# Patient Record
Sex: Female | Born: 1965 | Race: Black or African American | Hispanic: No | Marital: Single | Smoking: Never smoker
Health system: Southern US, Community
[De-identification: ages and names within clinical notes are randomized; demographics above are authoritative.]

## PROBLEM LIST (undated history)

## (undated) DIAGNOSIS — M1990 Unspecified osteoarthritis, unspecified site: Secondary | ICD-10-CM

## (undated) DIAGNOSIS — M51379 Other intervertebral disc degeneration, lumbosacral region without mention of lumbar back pain or lower extremity pain: Secondary | ICD-10-CM

## (undated) DIAGNOSIS — S92909A Unspecified fracture of unspecified foot, initial encounter for closed fracture: Secondary | ICD-10-CM

## (undated) DIAGNOSIS — M8580 Other specified disorders of bone density and structure, unspecified site: Secondary | ICD-10-CM

## (undated) DIAGNOSIS — M5137 Other intervertebral disc degeneration, lumbosacral region: Secondary | ICD-10-CM

## (undated) DIAGNOSIS — I10 Essential (primary) hypertension: Secondary | ICD-10-CM

## (undated) HISTORY — DX: Essential (primary) hypertension: I10

## (undated) HISTORY — PX: BREAST LUMPECTOMY: SHX2

## (undated) HISTORY — PX: VAGINAL HYSTERECTOMY: SUR661

## (undated) HISTORY — DX: Unspecified fracture of unspecified foot, initial encounter for closed fracture: S92.909A

## (undated) HISTORY — DX: Other specified disorders of bone density and structure, unspecified site: M85.80

## (undated) HISTORY — PX: OOPHORECTOMY: SHX86

## (undated) HISTORY — DX: Other intervertebral disc degeneration, lumbosacral region without mention of lumbar back pain or lower extremity pain: M51.379

## (undated) HISTORY — DX: Other intervertebral disc degeneration, lumbosacral region: M51.37

## (undated) HISTORY — DX: Unspecified osteoarthritis, unspecified site: M19.90

---

## 2005-02-28 ENCOUNTER — Other Ambulatory Visit: Admission: RE | Admit: 2005-02-28 | Discharge: 2005-02-28 | Payer: Self-pay | Admitting: Gynecology

## 2006-06-28 ENCOUNTER — Other Ambulatory Visit: Admission: RE | Admit: 2006-06-28 | Discharge: 2006-06-28 | Payer: Self-pay | Admitting: Gynecology

## 2007-07-21 ENCOUNTER — Other Ambulatory Visit: Admission: RE | Admit: 2007-07-21 | Discharge: 2007-07-21 | Payer: Self-pay | Admitting: Gynecology

## 2007-10-29 ENCOUNTER — Encounter: Admission: RE | Admit: 2007-10-29 | Discharge: 2007-10-29 | Payer: Self-pay | Admitting: Surgery

## 2007-10-30 ENCOUNTER — Ambulatory Visit (HOSPITAL_BASED_OUTPATIENT_CLINIC_OR_DEPARTMENT_OTHER): Admission: RE | Admit: 2007-10-30 | Discharge: 2007-10-30 | Payer: Self-pay | Admitting: Surgery

## 2007-10-30 ENCOUNTER — Encounter (INDEPENDENT_AMBULATORY_CARE_PROVIDER_SITE_OTHER): Payer: Self-pay | Admitting: Surgery

## 2008-07-21 ENCOUNTER — Other Ambulatory Visit: Admission: RE | Admit: 2008-07-21 | Discharge: 2008-07-21 | Payer: Self-pay | Admitting: Gynecology

## 2008-07-21 ENCOUNTER — Encounter: Payer: Self-pay | Admitting: Women's Health

## 2008-07-21 ENCOUNTER — Ambulatory Visit: Payer: Self-pay | Admitting: Women's Health

## 2008-07-29 ENCOUNTER — Ambulatory Visit: Payer: Self-pay | Admitting: Women's Health

## 2008-08-12 ENCOUNTER — Ambulatory Visit: Payer: Self-pay | Admitting: Gynecology

## 2008-08-31 ENCOUNTER — Ambulatory Visit: Payer: Self-pay | Admitting: Gynecology

## 2008-10-20 ENCOUNTER — Ambulatory Visit: Payer: Self-pay | Admitting: Gynecology

## 2008-11-01 ENCOUNTER — Encounter: Payer: Self-pay | Admitting: Gynecology

## 2008-11-01 ENCOUNTER — Ambulatory Visit (HOSPITAL_COMMUNITY): Admission: RE | Admit: 2008-11-01 | Discharge: 2008-11-02 | Payer: Self-pay | Admitting: Gynecology

## 2008-11-01 ENCOUNTER — Ambulatory Visit: Payer: Self-pay | Admitting: Gynecology

## 2008-11-15 ENCOUNTER — Ambulatory Visit: Payer: Self-pay | Admitting: Gynecology

## 2008-11-25 ENCOUNTER — Ambulatory Visit: Payer: Self-pay | Admitting: Gynecology

## 2008-12-15 ENCOUNTER — Ambulatory Visit: Payer: Self-pay | Admitting: Gynecology

## 2009-07-15 IMAGING — CR DG CHEST 2V
2 series · 2 of 2 positions shown · non-contrast
Comparison: None

CLINICAL DATA: Left breast cancer

CHEST - 2 VIEW

[w chest pa]
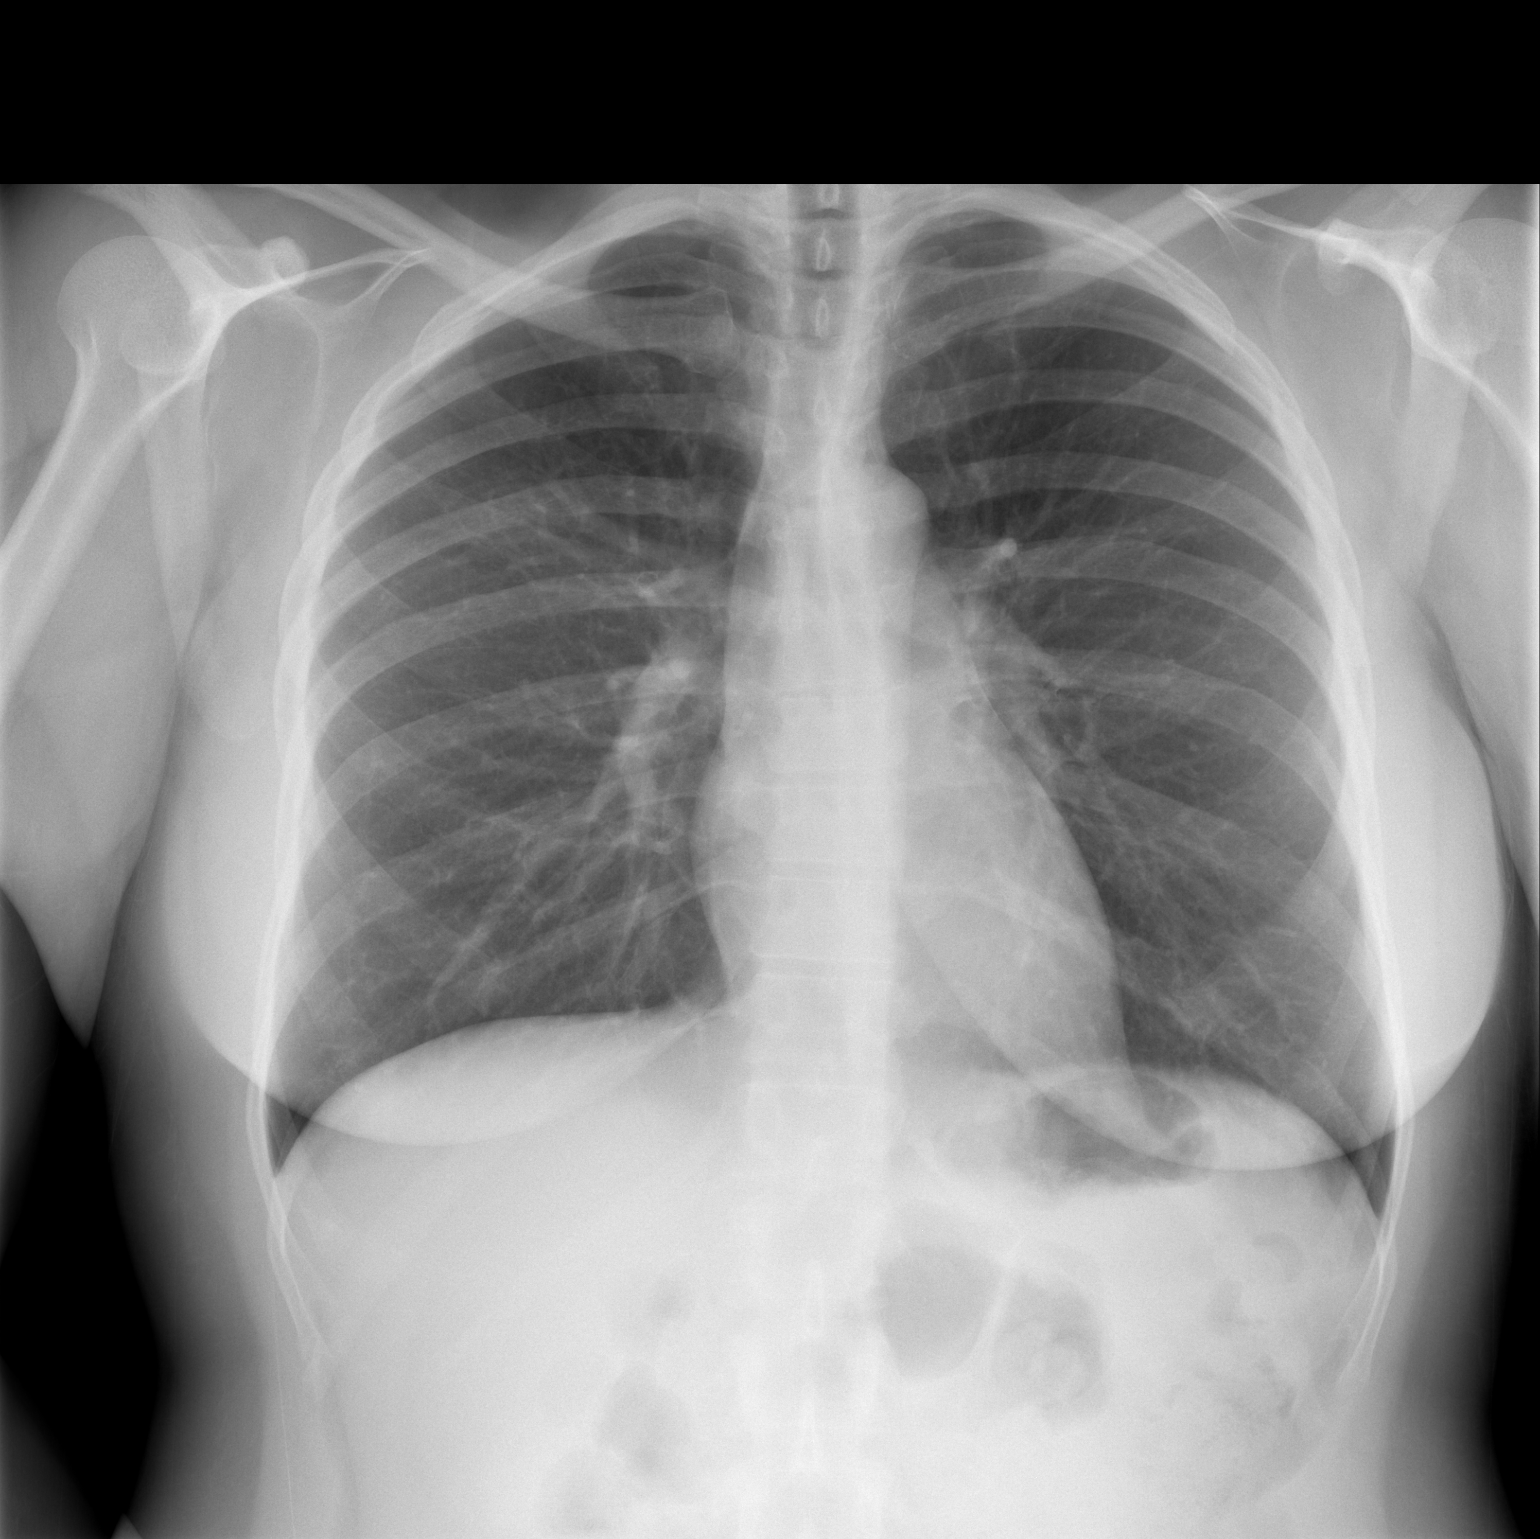

[w chest lat]
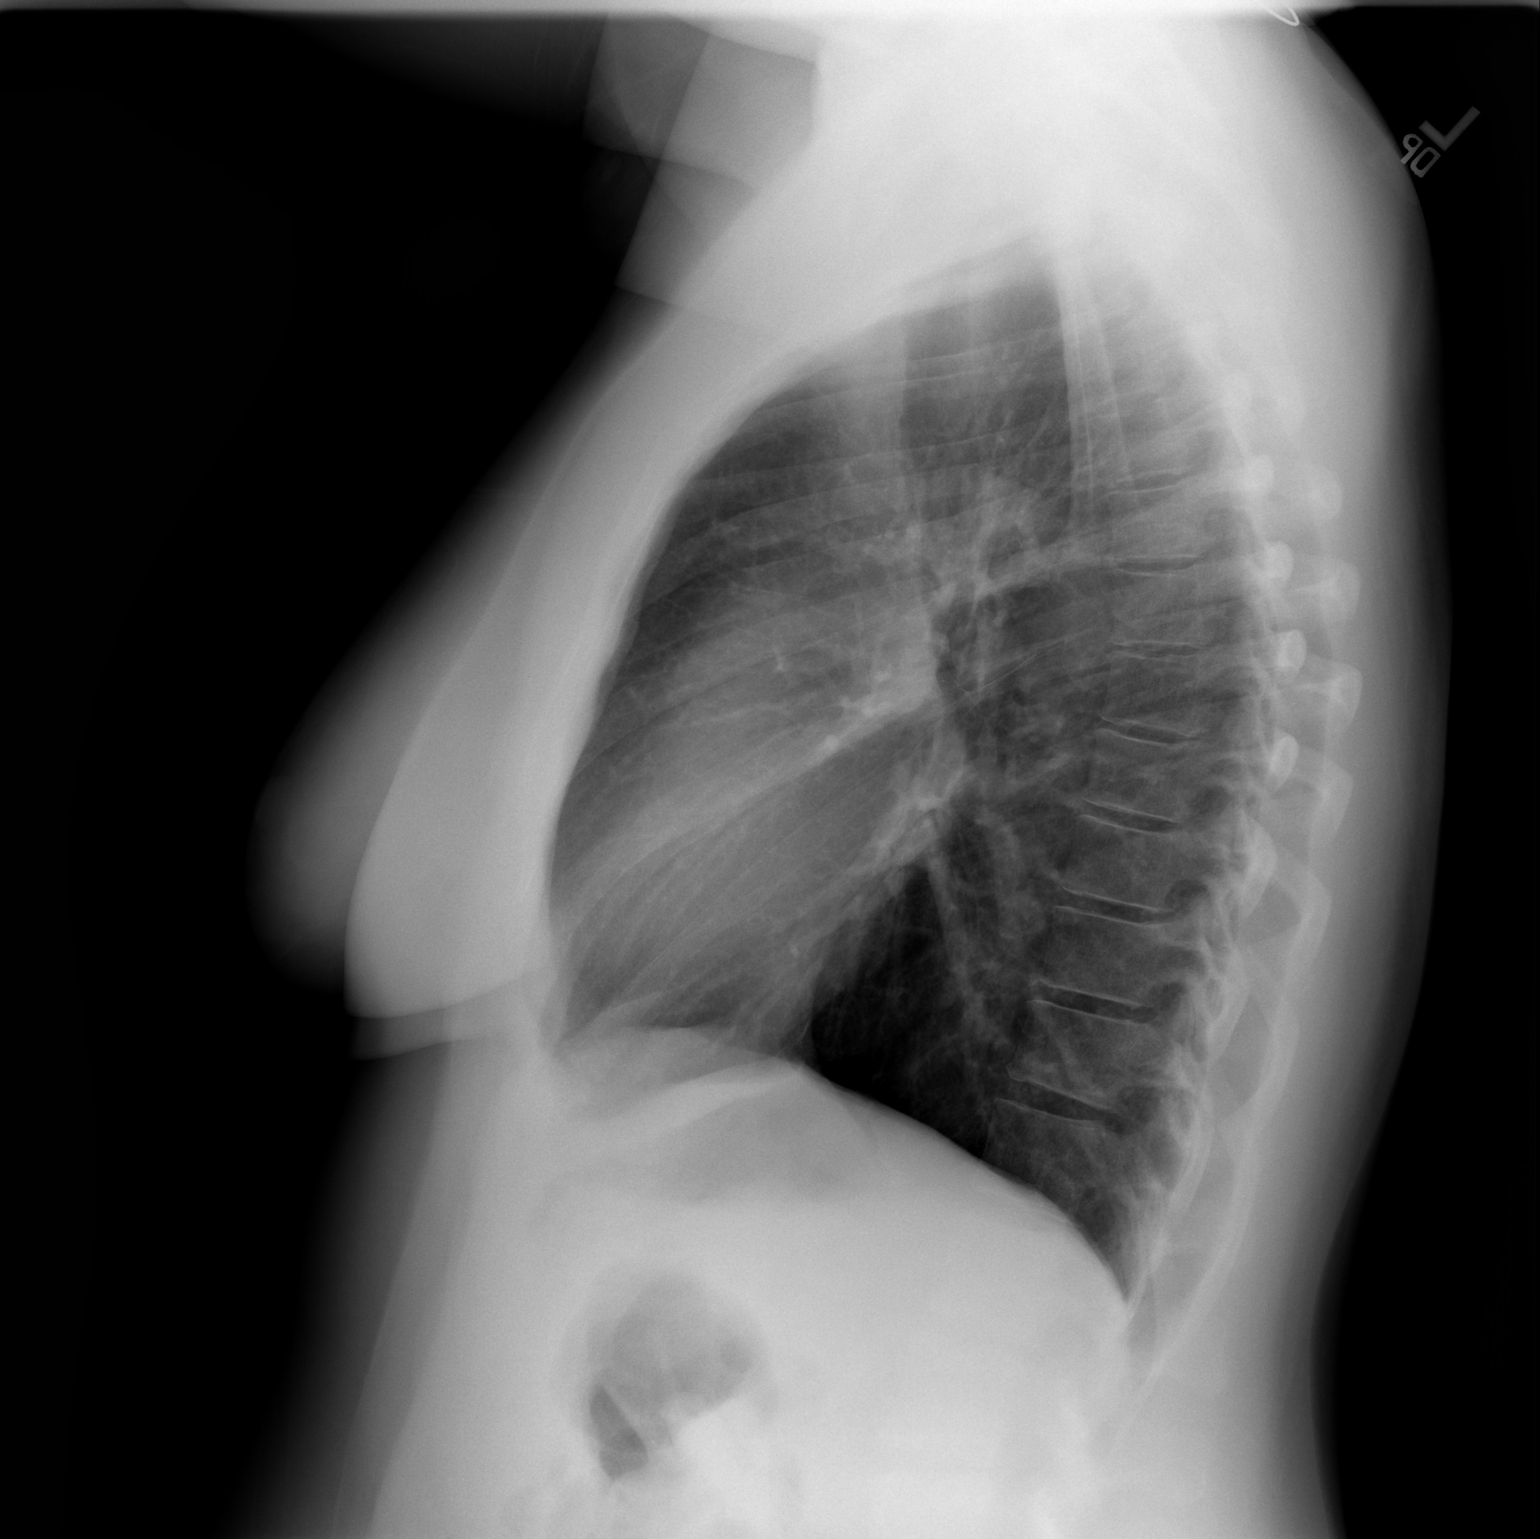

[2 of 2 positions shown; findings below may reference images not displayed]

FINDINGS: The heart size and mediastinal contours are within normal
limits.  Both lungs are clear.  The visualized skeletal structures
are unremarkable.
IMPRESSION: No active disease.

## 2009-08-15 ENCOUNTER — Other Ambulatory Visit: Admission: RE | Admit: 2009-08-15 | Discharge: 2009-08-15 | Payer: Self-pay | Admitting: Gynecology

## 2009-08-15 ENCOUNTER — Ambulatory Visit: Payer: Self-pay | Admitting: Women's Health

## 2009-09-05 ENCOUNTER — Ambulatory Visit: Payer: Self-pay | Admitting: Women's Health

## 2010-08-14 LAB — COMPREHENSIVE METABOLIC PANEL
AST: 25 U/L (ref 0–37)
Albumin: 3.9 g/dL (ref 3.5–5.2)
Alkaline Phosphatase: 61 U/L (ref 39–117)
Chloride: 102 mEq/L (ref 96–112)
GFR calc Af Amer: >60 mL/min (ref >60)
Potassium: 3.1 mEq/L — ABNORMAL LOW (ref 3.5–5.1)
Total Bilirubin: 0.9 mg/dL (ref 0.3–1.2)
Total Protein: 7.4 g/dL (ref 6.0–8.3)

## 2010-08-14 LAB — CBC
HCT: 29.1 % — ABNORMAL LOW (ref 36.0–46.0)
MCHC: 33.8 g/dL (ref 30.0–36.0)
MCV: 91 fL (ref 78.0–100.0)
Platelets: 160 10*3/uL (ref 150–400)
Platelets: 237 10*3/uL (ref 150–400)
RBC: 3.2 MIL/uL — ABNORMAL LOW (ref 3.87–5.11)
RDW: 16.4 % — ABNORMAL HIGH (ref 11.5–15.5)
WBC: 4.9 10*3/uL (ref 4.0–10.5)

## 2010-08-14 LAB — ELECTROLYTE PANEL
CO2: 30 mEq/L (ref 19–32)
Sodium: 138 mEq/L (ref 135–145)

## 2010-08-17 ENCOUNTER — Encounter: Payer: Self-pay | Admitting: Women's Health

## 2010-08-24 ENCOUNTER — Encounter: Payer: Self-pay | Admitting: Women's Health

## 2010-09-19 NOTE — Discharge Summary (Signed)
NAME:  Breanna Castro, Breanna Castro            ACCOUNT NO.:  [id number]   MEDICAL RECORD NO.:  [id number]          PATIENT TYPE:  OIB   LOCATION:  9320                          FACILITY:  [facility]   PHYSICIAN:  Timothy P. Fontaine, M.D.DATE OF BIRTH:  [birth year]   DATE OF ADMISSION:  11/01/2008  DATE OF DISCHARGE:  11/02/2008                               DISCHARGE SUMMARY   DISCHARGE DIAGNOSES:  Dysmenorrhea, menorrhagia, leiomyoma, right  hydrosalpinx, iron-deficiency anemia, hypokalemia.   PROCEDURE:  Laparoscopic-assisted vaginal hysterectomy, bilateral  salpingo-oophorectomy, November 01, 2008.   PATHOLOGY:  [id number]  Leimyomata, Adenomyosis, Right hydrosalpinx  uterine weight clinically 487 grams   HOSPITAL COURSE:  A 45 year old female with increasing menorrhagia,  dysmenorrhea, leiomyoma, history of right suspected hydrosalpinx.  The  patient underwent Lupron suppression for hemoglobin recovery and  leiomyoma shrinkage preoperatively and then ultimately underwent an  uncomplicated LAVH, BSO November 01, 2008.  The patient's postoperative  course was uncomplicated.  She was discharged on postoperative day #1.  Ambulating well, tolerating a regular diet, voiding without difficulty.  The patient's preoperative hemoglobin was 12.8, postoperative was 9.8,  and she was asymptomatic.  The patient also was noted to have a  preoperative potassium of 3.1.  She was given additional potassium  intravenously and her postoperative IV solutions, and on postoperative  day #1, was noted to have a potassium of 3.1.  She was instructed to  increase potassium at home at 20 mEq daily and will have a potassium  rechecked at a postoperative visit.  The patient received precautions,  instructions, and follow up and was given a discharge prescription for  Tylox #25, 1-2 p.o. q.4-6 h for pain and will be seen in 2 weeks  following discharge.       Timothy P. Fontaine, M.D.  Electronically Signed     TPF/MEDQ   D:  11/02/2008  T:  11/02/2008  Job:  [id number]

## 2010-09-19 NOTE — Op Note (Signed)
NAME:  Breanna Castro, Breanna Castro            ACCOUNT NO.:  [id number]   MEDICAL RECORD NO.:  [id number]          PATIENT TYPE:  AMB   LOCATION:  DSC                          FACILITY:  [facility]   PHYSICIAN:  Thomas A. Cornett, M.D.DATE OF BIRTH:  October 13, 1965   DATE OF PROCEDURE:  10/30/2007  DATE OF DISCHARGE:                               OPERATIVE REPORT   PREOPERATIVE DIAGNOSIS:  Left breast mass.   POSTOPERATIVE DIAGNOSIS:  Left breast mass.   PROCEDURE:  Left breast needle-localized lumpectomy.   SURGEON:  Maisie Fus A. Cornett, M.D.   ANESTHESIA:  MAC with 0.25% Sensorcaine local.   ESTIMATED BLOOD LOSS:  20 mL.   SPECIMEN:  Left breast mass with localizing wire and clip, verified by  specimen radiograph to be accurate.   DRAINS:  None.   INDICATIONS FOR PROCEDURE:  The patient is a 45 year old female who had  left breast mass biopsy.  There was some concern this may be phyllodes  tumor versus  fibroadenoma and excision was recommended.  She presents  today for excision of this mass using needle localization technique in  order to establish a definitive diagnosis.  Risks were discussed with  the patient.  She understood the risks and agreed to proceed.   DESCRIPTION OF PROCEDURE:  The patient was brought to the operating room  and placed supine.  Left breast was prepped and draped in sterile  fashion, after induction of MAC anesthesia.  A 0.25% Sensorcaine with  epinephrine was used in the area around the wire site which exceeded  about 3 o'clock on the left breast was injected.  Curvilinear incision  was made around the wire.  All tissue around the wire was excised to  include the mass.  Radiograph revealed the mass to be intact with clip  in place.  Irrigation was used suctioned out.  Hemostasis  was excellent.  The wound was closed in layers using a deep layer of 3-0  Vicryl and 4-0 Monocryl subcuticular stitch.  Dermabond was applied.  All final counts of sponge, needle, and  instruments were found to be  correct at this portion of the case.  The patient was then awoke taken  to recovery in satisfactory condition.      Thomas A. Cornett, M.D.  Electronically Signed     TAC/MEDQ  D:  10/30/2007  T:  10/31/2007  Job:  [id number]   cc:   Claris Che L. Yolanda Bonine, M.D.  Estrella Myrtle. Chestine Spore, [place]

## 2010-09-19 NOTE — Op Note (Signed)
NAME:  Breanna Castro, Breanna Castro            ACCOUNT NO.:  [id number]   MEDICAL RECORD NO.:  [id number]          PATIENT TYPE:  AMB   LOCATION:  SDC                           FACILITY:  [facility]   PHYSICIAN:  Timothy P. Fontaine, M.D.DATE OF BIRTH:  07/15/1965   DATE OF PROCEDURE:  11/01/2008  DATE OF DISCHARGE:                               OPERATIVE REPORT   PREOPERATIVE DIAGNOSES:  1. Leiomyoma.  2. Right hydrosalpinx.  3. Iron deficiency anemia.  4. Menorrhagia.  5. Dysmenorrhea.   POSTOPERATIVE DIAGNOSES:  1. Leiomyoma.  2. Right hydrosalpinx.  3. Iron deficiency anemia.  4. Menorrhagia.  5. Dysmenorrhea.   PROCEDURES:  1. Laparoscopic-assisted vaginal hysterectomy.  2. Bilateral salpingo-oophorectomy.   SURGEON:  Timothy P. Fontaine, MD   ASSISTANT:  Gaetano Hawthorne. Lily Peer, MD   ANESTHESIA:  General.   ESTIMATED BLOOD LOSS:  300 mL.   COMPLICATIONS:  None.   SPECIMEN:  Uterus.   CLINICAL WEIGHT:  480 g.   FINDINGS:  EUA:  External BUS, vagina grossly normal.  Cervix normal.  Uterus bulky, midline, mobile.  Adnexa without gross masses.  Surgical:  Anterior cul-de-sac normal.  Posterior cul-de-sac normal.  Uterus  enlarged with multiple myoma.  Right fallopian tube with evidence of  prior tubal sterilization.  Left fallopian tube enlarged with sausage-  shaped hydrosalpinx.  Right and left ovaries grossly normal, free and  mobile.  No evidence of endometriosis or pelvic adhesive disease.  Upper  abdominal exam was grossly normal to limited inspection.   PROCEDURE:  The patient was taken to the operating room, underwent  general anesthesia, was placed in low dorsal lithotomy position;  received an abdominal, perineal, vaginal preparation with Betadine  solution.  Bladder emptied with indwelling Foley catheterization.  EUA  performed and a Hulka tenaculum was placed in the cervix.  The patient  was draped in usual fashion.  A repeat transverse infraumbilical  incision was made  using the 10-mm OptiVu direct entry trocar.  The  abdomen was directly entered under direct visualization without  difficulty and subsequently insufflated.  Right and left 5-mm suprapubic  ports were then placed under direct visualization after  transillumination for the vessels without difficulty.  Examination of  pelvic organs and upper abdominal exam was carried out with findings  noted above.  Due to the bulk of the hydrosalpinx obscuring the right  adnexa, this was drained using a needle and aspiration to deflate the  hydrosalpinx.  After better visualization, the right infundibulopelvic  ligament and vessels were identified and elevated.  The ureter found to  be away from the operative site and using the Harmonic scalpel.  The  pedicle was transected without difficulty.  The broad ligament  peritoneal reflection was likewise transected to the level of the round  ligament which again was transected using the Harmonic scalpel.  The  right uterine vessel was transected without difficulty and the  vesicouterine peritoneal fold was transected to the midline.  Attention  was then turned to the left adnexa which was exposed.  The left  infundibulopelvic ligament again was isolated.  The ureter found to be  away from the operative site and was subsequently transected with the  Harmonic scalpel.  The remainder of the left adnexa was freed in a  process similar to the right meeting the vesicouterine peritoneal  incision anteriorly in the midline.  Attention was then turned to the  vaginal portion of the procedure.  The patient was placed in a high  dorsal lithotomy position and the Hulka tenaculum removed.  A weighted  speculum placed.  The cervix grasped and the cervical mucosa was  circumferentially injected using lidocaine and epinephrine mixture.  The  cervical mucosa was then sharply incised circumferentially and the  paracervical planes were sharply developed.  The posterior cul-de-sac   was ultimately entered.  A long weighted speculum placed and the right  and left uterosacral ligaments were identified, clamped, cut, and  ligated using 0 Vicryl suture and tagged for future reference.  The  anterior vesicouterine plane was sharply developed and the anterior cul-  de-sac was ultimately entered and the uterus was progressively freed  from its attachments through clamping, cutting, and ligating of the  cardinal ligaments and parametrial tissues using 0 Vicryl suture.  It  was evident that morcellation was necessary to remove the uterus and at  this point, the cervix was cored from the uterus and subsequently the  uterus morcellated and removed in pieces and ultimately any remaining  peritoneal connections were clamped, cut, and ligated using 0 Vicryl  suture.  The long weighted speculum was replaced with a shorter weighted  speculum.  The posterior vaginal cuff grasped with an Allis clamp.  A  tagged tail sponge moist was placed within the vagina to pack the  intestines from the posterior cul-de-sac.  A 0 Vicryl running  interlocking suture was placed from uterosacral ligament to uterosacral  ligament reapproximating the posterior vaginal cuff.  The sponge was  removed.  The posterior cul-de-sac irrigated.  Hemostasis visualized and  the vagina was closed anterior to posterior using 0 Vicryl suture in  interrupted figure-of-eight stitch.  The patient was placed in a low  dorsal lithotomy position.  The vagina was irrigated.  Hemostasis  visualized.  The operative team regloved and the abdomen was  reinsufflated.  All clot was removed.  The pelvis irrigated and  hemostasis was visualized.  All pedicles as well as the cuff showed  hemostasis under a low pressure situation.  The 5-mm ports were then  removed under a low pressure situation again showing hemostasis.  The  infraumbilical port was backed out under direct visualization showing  adequate hemostasis.  No evidence of  hernia formation.  A 0 Vicryl  interrupted subcutaneous fascial stitch was placed infraumbilically.  All skin incisions injected using 0.25% Marcaine.  The patient received  intraoperative Toradol and all skin incisions closed using 3-0 chromic  in interrupted cuticular stitch.  The patient was placed in the supine  position, awakened without difficulty and taken to recovery room in good  condition having tolerated the procedure well.      Timothy P. Fontaine, M.D.  Electronically Signed     TPF/MEDQ  D:  11/01/2008  T:  11/02/2008  Job:  [id number]

## 2010-09-19 NOTE — H&P (Signed)
NAME:  Breanna Castro, Breanna Castro            ACCOUNT NO.:  [id number]   MEDICAL RECORD NO.:  [id number]          PATIENT TYPE:  AMB   LOCATION:  SDC                           FACILITY:  [facility]   PHYSICIAN:  Timothy P. Fontaine, M.D.DATE OF BIRTH:  Jun 22, 1965   DATE OF ADMISSION:  DATE OF DISCHARGE:                              HISTORY & PHYSICAL   CHIEF COMPLAINT:  Menorrhagia, dysmenorrhea.   HISTORY OF PRESENT ILLNESS:  A 45 year old G69, P3 female, tubal  sterilization, history of increasing dysmenorrhea and menorrhagia, found  to have large myoma measuring 11 cm and a cystic serpentine mass in the  right adnexa consistent with a hydrosalpinx.  The patient was suppressed  on Depo-Lupron for approximately 3 months preoperatively and was  admitted for attempted LAVH/BSO, possible TAH-BSO.  Alternatives for  management were reviewed with the patient for more conservative  management.  She wants proceed with definitive surgery.   PAST MEDICAL HISTORY:  Significant for hypertension, anxiety, and  depression.   PAST SURGICAL HISTORY:  A breast lumpectomy and a tubal sterilization,  which included a mini lap and a subsequent laparoscopic tubal  sterilization.   CURRENT MEDICATIONS:  Lisinopril, hydrochlorothiazide, and Zoloft.   MEDICATION ALLERGIES:  None.   REVIEW OF SYSTEMS:  Noncontributory.   FAMILY HISTORY:  Noncontributory.   SOCIAL HISTORY:  Noncontributory.   ADMISSION PHYSICAL EXAMINATION:  VITAL SIGNS:  Afebrile, stable.  HEENT: Normal.  LUNGS:  Clear.  CARDIAC:  Regular rate without rubs, murmurs, or gallops.  ABDOMEN:  Benign.  PELVIC:  External BUS, vagina normal.  Cervix normal.  Uterus enlarged,  approximately 10-week size, midline and mobile.  Adnexa without gross  masses or tenderness.   ASSESSMENT:  A 45 year old G43, P3 female, status post tubal  sterilization, worsening dysmenorrhea, menorrhagia, iron-deficiency  anemia with large myoma, questionable hydrosalpinx in  the right adnexa  for hysterectomy BSO.  Options for management were reviewed to include  hormonal manipulation, IUD, myomectomy, embolization, and hysterectomy.  She wants to proceed with hysterectomy.  The long-term issues with  hysterectomy were reviewed to include the absolute irreversible  sterility associated with hysterectomy.  Sexuality following  hysterectomy to include persistent orgasmic dysfunction as well as  persistent dyspareunia and the ovarian conservation issue was reviewed  with her.  The options of keeping both ovaries or one ovary for  continued hormone production versus removing both ovaries was discussed.  She understands by removing her ovaries she will be hypoestrogenic  symptomatic as well as potential for accelerating disease such as  cardiovascular disease and bone disease and the potential significant  symptoms for estrogen replacement therapy.  [facility] study was reviewed  potential for increased risks of thrombosis, stroke, heart attack, DVT,  as well as  possible increased risks of breast cancer were reviewed.  The options of keeping one or both ovaries for hormone production and  the risk of ovarian disease in the future such as benign ovarian  disease, system pain as well as ovarian cancer was discussed.  The  patient has thought about these issues and wants to proceed with  removing both ovaries.  She accepts the risks of hypoestrogenism and the  potential for ERT.  We also discussed realistically, we will attempt  initially a laparoscopic approach, but given the size of the myoma as  well as the hydrosalpinx that there may be significant adhesive disease  encountered and that there is a realistic possibility of proceeding with  a total abdominal hysterectomy and bilateral salpingo-oophorectomy.  She  understands and accepts this, both from a longer hospitalization and  longer recovery standpoint.  The acute intraoperative postoperative  risks associated  hysterectomy were reviewed to include the risk of  infection, prolonged antibiotics, abscess or hematoma formation  requiring reoperation and drainage, incisional complications requiring  opening and draining of incisions closure by secondary intention, long-  term issues of hernia formation and keloid formation.  The risk of  hemorrhage necessitating transfusion and risks of transfusion were  reviewed to include transfusion reaction, hepatitis, HIV, McArdle  disease, and other unknown entities.  The risk of inadvertent injury to  internal organs including bowel, bladder, ureters, vessels and nerves  either immediately recognized or delay recognized was reviewed and the  issue with adhesive disease and hydrosalpinx in the realistic risk of  injury to surrounding structures was discussed and this may necessitate  major exploratory reparative surgery, bowel resection, bladder repair,  ureteral damage repair as well as ostomy formation possibilities was all  discussed, understood and accepted.  The patient's questions were  answered to her satisfaction.  Again, she has been on Depo-Lupron  suppression due to her iron-deficiency anemia, has been amenorrhea,  again we will go ahead and check a preoperative hemoglobin which is  pending at this time.  The patient also was placed on a bowel prep again  due to the possibility of adhesive disease.  The patient's questions  answered to her satisfaction.  She is ready to proceed with surgery.      Timothy P. Fontaine, M.D.  Electronically Signed     TPF/MEDQ  D:  10/20/2008  T:  10/21/2008  Job:  [id number]

## 2010-09-27 ENCOUNTER — Other Ambulatory Visit: Payer: Self-pay | Admitting: Women's Health

## 2010-09-27 ENCOUNTER — Other Ambulatory Visit (HOSPITAL_COMMUNITY)
Admission: RE | Admit: 2010-09-27 | Discharge: 2010-09-27 | Disposition: A | Discharge status: Home / Self Care | Payer: BC Managed Care – PPO | Source: Ambulatory Visit | Attending: Gynecology | Admitting: Gynecology

## 2010-09-27 ENCOUNTER — Encounter (INDEPENDENT_AMBULATORY_CARE_PROVIDER_SITE_OTHER): Payer: BC Managed Care – PPO | Admitting: Women's Health

## 2010-09-27 DIAGNOSIS — Z01419 Encounter for gynecological examination (general) (routine) without abnormal findings: Secondary | ICD-10-CM

## 2010-09-27 DIAGNOSIS — Z124 Encounter for screening for malignant neoplasm of cervix: Secondary | ICD-10-CM | POA: Insufficient documentation

## 2010-10-17 ENCOUNTER — Encounter (INDEPENDENT_AMBULATORY_CARE_PROVIDER_SITE_OTHER): Payer: BC Managed Care – PPO

## 2010-10-17 DIAGNOSIS — M899 Disorder of bone, unspecified: Secondary | ICD-10-CM

## 2010-10-26 ENCOUNTER — Other Ambulatory Visit (INDEPENDENT_AMBULATORY_CARE_PROVIDER_SITE_OTHER): Payer: BC Managed Care – PPO

## 2010-10-26 DIAGNOSIS — E559 Vitamin D deficiency, unspecified: Secondary | ICD-10-CM

## 2010-11-06 ENCOUNTER — Other Ambulatory Visit: Payer: BC Managed Care – PPO

## 2011-02-01 LAB — BASIC METABOLIC PANEL
CO2: 28
Chloride: 104
Creatinine, Ser: 0.89
GFR calc Af Amer: >60
Potassium: 3.4 — ABNORMAL LOW

## 2011-02-01 LAB — CBC
HCT: 29.1 — ABNORMAL LOW
MCHC: 33
MCV: 80.7
RBC: 3.6 — ABNORMAL LOW
WBC: 5.3

## 2011-02-01 LAB — DIFFERENTIAL
Basophils Relative: 1
Eosinophils Absolute: 0.1
Eosinophils Relative: 1
Lymphs Abs: 1.2
Monocytes Absolute: 0.5
Monocytes Relative: 10
Neutrophils Relative %: 65

## 2011-03-23 ENCOUNTER — Ambulatory Visit (INDEPENDENT_AMBULATORY_CARE_PROVIDER_SITE_OTHER): Payer: BC Managed Care – PPO | Admitting: *Deleted

## 2011-03-23 DIAGNOSIS — E559 Vitamin D deficiency, unspecified: Secondary | ICD-10-CM

## 2011-09-28 ENCOUNTER — Encounter: Payer: BC Managed Care – PPO | Admitting: Women's Health

## 2011-10-12 ENCOUNTER — Ambulatory Visit (INDEPENDENT_AMBULATORY_CARE_PROVIDER_SITE_OTHER): Payer: BC Managed Care – PPO | Admitting: Women's Health

## 2011-10-12 ENCOUNTER — Encounter: Payer: Self-pay | Admitting: Women's Health

## 2011-10-12 VITALS — BP 130/80 | Ht 65.25 in | Wt 208.0 lb

## 2011-10-12 DIAGNOSIS — I10 Essential (primary) hypertension: Secondary | ICD-10-CM | POA: Insufficient documentation

## 2011-10-12 DIAGNOSIS — F32A Depression, unspecified: Secondary | ICD-10-CM | POA: Insufficient documentation

## 2011-10-12 DIAGNOSIS — Z01419 Encounter for gynecological examination (general) (routine) without abnormal findings: Secondary | ICD-10-CM

## 2011-10-12 DIAGNOSIS — F341 Dysthymic disorder: Secondary | ICD-10-CM

## 2011-10-12 DIAGNOSIS — F329 Major depressive disorder, single episode, unspecified: Secondary | ICD-10-CM

## 2011-10-12 DIAGNOSIS — Z780 Asymptomatic menopausal state: Secondary | ICD-10-CM

## 2011-10-12 MED ORDER — ESTRADIOL 0.05 MG/24HR TD PTWK: 1.0000 | MEDICATED_PATCH | TRANSDERMAL | Status: DC

## 2011-10-12 NOTE — Progress Notes (Signed)
Breanna Castro 1965-09-04 [id number]    History:    The patient presents for annual exam.  LAVH with BSO for fibroids and menorrhagia/2010. States is doing much better since hysterectomy but is struggling with sleep due to numerous hot flushes. Initially states did not want to use hormones. History of mammograms with negative biopsies//fibroadenoma/left breast 2009. Osteopenia T score -1.2 at the spine/2012. Hypertension and history of anxiety and depression treated by primary care.   Past medical history, past surgical history, family history and social history were all reviewed and documented in the EPIC chart. Pre-K. teacher for [employer], has 2 adopted daughters ages 10 and 32, 2 stepchildren and 3 biological daughters. Normal colonoscopy 2010   ROS:  A  ROS was performed and pertinent positives and negatives are included in the history.  Exam:  Filed Vitals:   10/12/11 1524  BP: 130/80    General appearance:  Normal Head/Neck:  Normal, without cervical or supraclavicular adenopathy. Thyroid:  Symmetrical, normal in size, without palpable masses or nodularity. Respiratory  Effort:  Normal  Auscultation:  Clear without wheezing or rhonchi Cardiovascular  Auscultation:  Regular rate, without rubs, murmurs or gallops  Edema/varicosities:  Not grossly evident Abdominal  Soft,nontender, without masses, guarding or rebound.  Liver/spleen:  No organomegaly noted  Hernia:  None appreciated  Skin  Inspection:  Grossly normal  Palpation:  Grossly normal Neurologic/psychiatric  Orientation:  Normal with appropriate conversation.  Mood/affect:  Normal  Genitourinary    Breasts: Examined lying and sitting.     Right: Without masses, retractions, discharge or axillary adenopathy.     Left: Without masses, retractions, discharge or axillary adenopathy.   Inguinal/mons:  Normal without inguinal adenopathy  External genitalia:  Normal  BUS/Urethra/Skene's glands:   Normal  Bladder:  Normal  Vagina:  Normal  Cervix: Absent  Uterus: Absent  Adnexa/parametria:     Rt: Without masses or tenderness.   Lt: Without masses or tenderness.  Anus and perineum: Normal  Digital rectal exam: Normal sphincter tone without palpated masses or tenderness  Assessment/Plan:  46 y.o. DBF G5P3 for annual exam with complaint of poor rest and sleep /hot flushes.   Postmenopausal with numerous hot flushes Osteopenia T score -1.2  spine 10/2010 Hypertension/anxiety and depression-primary care labs and medications.  Plan: Options for hot flushes reviewed, reviewed estrogen patches. Reviewed slight risk for blood clots, strokes, breast cancer. Will try estradiol 0.05 patch weekly, prescription, proper use, given and reviewed instructed to call if symptoms do not decrease. Reviewed importance of exercise, decreasing calories for weight loss, vitamin D 2000 daily and calcium rich diet. Home safety and fall prevention reviewed. SBE's, annual mammogram, which is scheduled tomorrow. Encouraged condoms if become sexually active.      Harrington Challenger [facility], 4:26 PM 10/12/2011

## 2011-10-12 NOTE — Patient Instructions (Signed)
Vit D 2000 daily Health Maintenance, Females A healthy lifestyle and preventative care can promote health and wellness.  Maintain regular health, dental, and eye exams.   Eat a healthy diet. Foods like vegetables, fruits, whole grains, low-fat dairy products, and lean protein foods contain the nutrients you need without too many calories. Decrease your intake of foods high in solid fats, added sugars, and salt. Get information about a proper diet from your caregiver, if necessary.   Regular physical exercise is one of the most important things you can do for your health. Most adults should get at least 150 minutes of moderate-intensity exercise (any activity that increases your heart rate and causes you to sweat) each week. In addition, most adults need muscle-strengthening exercises on 2 or more days a week.    Maintain a healthy weight. The body mass index (BMI) is a screening tool to identify possible weight problems. It provides an estimate of body fat based on height and weight. Your caregiver can help determine your BMI, and can help you achieve or maintain a healthy weight. For adults 20 years and older:   A BMI below 18.5 is considered underweight.   A BMI of 18.5 to 24.9 is normal.   A BMI of 25 to 29.9 is considered overweight.   A BMI of 30 and above is considered obese.   Maintain normal blood lipids and cholesterol by exercising and minimizing your intake of saturated fat. Eat a balanced diet with plenty of fruits and vegetables. Blood tests for lipids and cholesterol should begin at age 60 and be repeated every 5 years. If your lipid or cholesterol levels are high, you are over 50, or you are a high risk for heart disease, you may need your cholesterol levels checked more frequently.Ongoing high lipid and cholesterol levels should be treated with medicines if diet and exercise are not effective.   If you smoke, find out from your caregiver how to quit. If you do not use tobacco,  do not start.   If you are pregnant, do not drink alcohol. If you are breastfeeding, be very cautious about drinking alcohol. If you are not pregnant and choose to drink alcohol, do not exceed 1 drink per day. One drink is considered to be 12 ounces (355 mL) of beer, 5 ounces (148 mL) of wine, or 1.5 ounces (44 mL) of liquor.   Avoid use of street drugs. Do not share needles with anyone. Ask for help if you need support or instructions about stopping the use of drugs.   High blood pressure causes heart disease and increases the risk of stroke. Blood pressure should be checked at least every 1 to 2 years. Ongoing high blood pressure should be treated with medicines, if weight loss and exercise are not effective.   If you are 53 to 49 years old, ask your caregiver if you should take aspirin to prevent strokes.   Diabetes screening involves taking a blood sample to check your fasting blood sugar level. This should be done once every 3 years, after age 30, if you are within normal weight and without risk factors for diabetes. Testing should be considered at a younger age or be carried out more frequently if you are overweight and have at least 1 risk factor for diabetes.   Breast cancer screening is essential preventative care for women. You should practice "breast self-awareness." This means understanding the normal appearance and feel of your breasts and may include breast self-examination. Any changes  detected, no matter how small, should be reported to a caregiver. Women in their 41s and 30s should have a clinical breast exam (CBE) by a caregiver as part of a regular health exam every 1 to 3 years. After age 25, women should have a CBE every year. Starting at age 71, women should consider having a mammogram (breast X-ray) every year. Women who have a family history of breast cancer should talk to their caregiver about genetic screening. Women at a high risk of breast cancer should talk to their caregiver  about having an MRI and a mammogram every year.   The Pap test is a screening test for cervical cancer. Women should have a Pap test starting at age 14. Between ages 56 and 30, Pap tests should be repeated every 2 years. Beginning at age 38, you should have a Pap test every 3 years as long as the past 3 Pap tests have been normal. If you had a hysterectomy for a problem that was not cancer or a condition that could lead to cancer, then you no longer need Pap tests. If you are between ages 9 and 68, and you have had normal Pap tests going back 10 years, you no longer need Pap tests. If you have had past treatment for cervical cancer or a condition that could lead to cancer, you need Pap tests and screening for cancer for at least 20 years after your treatment. If Pap tests have been discontinued, risk factors (such as a new sexual partner) need to be reassessed to determine if screening should be resumed. Some women have medical problems that increase the chance of getting cervical cancer. In these cases, your caregiver may recommend more frequent screening and Pap tests.   The human papillomavirus (HPV) test is an additional test that may be used for cervical cancer screening. The HPV test looks for the virus that can cause the cell changes on the cervix. The cells collected during the Pap test can be tested for HPV. The HPV test could be used to screen women aged 21 years and older, and should be used in women of any age who have unclear Pap test results. After the age of 77, women should have HPV testing at the same frequency as a Pap test.   Colorectal cancer can be detected and often prevented. Most routine colorectal cancer screening begins at the age of 69 and continues through age 44. However, your caregiver may recommend screening at an earlier age if you have risk factors for colon cancer. On a yearly basis, your caregiver may provide home test kits to check for hidden blood in the stool. Use of a  small camera at the end of a tube, to directly examine the colon (sigmoidoscopy or colonoscopy), can detect the earliest forms of colorectal cancer. Talk to your caregiver about this at age 33, when routine screening begins. Direct examination of the colon should be repeated every 5 to 10 years through age 36, unless early forms of pre-cancerous polyps or small growths are found.   Hepatitis C blood testing is recommended for all people born from [birth year] through 1967 and any individual with known risks for hepatitis C.   Practice safe sex. Use condoms and avoid high-risk sexual practices to reduce the spread of sexually transmitted infections (STIs). Sexually active women aged 56 and younger should be checked for Chlamydia, which is a common sexually transmitted infection. Older women with new or multiple partners should also be tested for  Chlamydia. Testing for other STIs is recommended if you are sexually active and at increased risk.   Osteoporosis is a disease in which the bones lose minerals and strength with aging. This can result in serious bone fractures. The risk of osteoporosis can be identified using a bone density scan. Women ages 53 and over and women at risk for fractures or osteoporosis should discuss screening with their caregivers. Ask your caregiver whether you should be taking a calcium supplement or vitamin D to reduce the rate of osteoporosis.   Menopause can be associated with physical symptoms and risks. Hormone replacement therapy is available to decrease symptoms and risks. You should talk to your caregiver about whether hormone replacement therapy is right for you.   Use sunscreen with a sun protection factor (SPF) of 30 or greater. Apply sunscreen liberally and repeatedly throughout the day. You should seek shade when your shadow is shorter than you. Protect yourself by wearing long sleeves, pants, a wide-brimmed hat, and sunglasses year round, whenever you are outdoors.   Notify  your caregiver of new moles or changes in moles, especially if there is a change in shape or color. Also notify your caregiver if a mole is larger than the size of a pencil eraser.   Stay current with your immunizations.  Document Released: 11/06/2010 Document Revised: 04/12/2011 Document Reviewed: 11/06/2010 [facility] Patient Information 2012 [place], [place].

## 2011-10-18 ENCOUNTER — Encounter: Payer: Self-pay | Admitting: Women's Health

## 2011-10-26 ENCOUNTER — Other Ambulatory Visit: Payer: Self-pay | Admitting: *Deleted

## 2011-10-26 DIAGNOSIS — R928 Other abnormal and inconclusive findings on diagnostic imaging of breast: Secondary | ICD-10-CM

## 2011-11-01 ENCOUNTER — Other Ambulatory Visit: Payer: Self-pay | Admitting: Women's Health

## 2011-11-01 DIAGNOSIS — R928 Other abnormal and inconclusive findings on diagnostic imaging of breast: Secondary | ICD-10-CM

## 2012-11-04 ENCOUNTER — Encounter: Payer: Self-pay | Admitting: Women's Health

## 2012-11-26 ENCOUNTER — Ambulatory Visit (INDEPENDENT_AMBULATORY_CARE_PROVIDER_SITE_OTHER): Payer: BC Managed Care – PPO | Admitting: Women's Health

## 2012-11-26 ENCOUNTER — Encounter: Payer: Self-pay | Admitting: Women's Health

## 2012-11-26 VITALS — BP 124/84 | Ht 65.75 in | Wt 206.0 lb

## 2012-11-26 DIAGNOSIS — M8580 Other specified disorders of bone density and structure, unspecified site: Secondary | ICD-10-CM

## 2012-11-26 DIAGNOSIS — Z01419 Encounter for gynecological examination (general) (routine) without abnormal findings: Secondary | ICD-10-CM

## 2012-11-26 DIAGNOSIS — M899 Disorder of bone, unspecified: Secondary | ICD-10-CM

## 2012-11-26 DIAGNOSIS — M949 Disorder of cartilage, unspecified: Secondary | ICD-10-CM

## 2012-11-26 NOTE — Progress Notes (Signed)
Breanna Castro 30-Jun-1965 [id number]    History:    The patient presents for annual exam.  LAVH with BSO for fibroids/menorrhagia 2010. History of normal Paps. 2012 Osteopenia T score -1.2 at [facility]. History of left breast fibroadenoma removed 2009 with normal mammograms after. Hypertension, anxiety/depression managed by primary care. Negative colonoscopy 2010. Had been on the estradiol patch stopped due to rash and declines need. Has chronic back pain which makes exercising difficult.  Past medical history, past surgical history, family history and social history were all reviewed and documented in the EPIC chart. Pre-K. Teacher. 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 Building control surveyor, Psychologist, occupational graduated [employer] working in [place]. Stepdaughter Breanna Castro lives with her continues to attend the community college. Will stepson graduated [employer]. Works at [employer].  Adopted daughters [name]10 and Breanna Castro 7  both ADHD with learning differences.   ROS:  A  ROS was performed and pertinent positives and negatives are included in the history.  Exam:  Filed Vitals:   11/26/12 1447  BP: 124/84    General appearance:  Normal Head/Neck:  Normal, without cervical or supraclavicular adenopathy. Thyroid:  Symmetrical, normal in size, without palpable masses or nodularity. Respiratory  Effort:  Normal  Auscultation:  Clear without wheezing or rhonchi Cardiovascular  Auscultation:  Regular rate, without rubs, murmurs or gallops  Edema/varicosities:  Not grossly evident Abdominal  Soft,nontender, without masses, guarding or rebound.  Liver/spleen:  No organomegaly noted  Hernia:  None appreciated  Skin  Inspection:  Grossly normal  Palpation:  Grossly normal Neurologic/psychiatric  Orientation:  Normal with appropriate conversation.  Mood/affect:  Normal  Genitourinary    Breasts: Examined lying and sitting.     Right: Without masses, retractions, discharge or axillary  adenopathy.     Left: Without masses, retractions, discharge or axillary adenopathy.   Inguinal/mons:  Normal without inguinal adenopathy  External genitalia:  Normal  BUS/Urethra/Skene's glands:  Normal  Bladder:  Normal  Vagina:  Normal  Cervix:  absent  Uterus:  Absent  Adnexa/parametria:     Rt: Without masses or tenderness.   Lt: Without masses or tenderness.  Anus and perineum: Normal  Digital rectal exam: Normal sphincter tone without palpated masses or tenderness  Assessment/Plan:  47 y.o. DBF G3P3 for annual exam with no complaints.  Postmenopausal with no HRT Osteopenia Hypertension/anxiety/depression-primary care labs and meds  Plan: HRT reviewed and declines need. SBE's, continue annual mammogram, calcium rich diet, vitamin D 2000 daily encouraged. Repeat DEXA will schedule. Reviewed importance of exercise in relationship to  bone health. Home Hemoccult card given with instructions. Condoms encouraged if becomes sexually active.    Harrington Challenger [facility], 5:12 PM 11/26/2012

## 2012-11-26 NOTE — Patient Instructions (Signed)
Health Recommendations for Postmenopausal Women Respected and ongoing research has looked at the most common causes of death, disability, and poor quality of life in postmenopausal women. The causes include heart disease, diseases of blood vessels, diabetes, depression, cancer, and bone loss (osteoporosis). Many things can be done to help lower the chances of developing these and other common problems: CARDIOVASCULAR DISEASE Heart Disease: A heart attack is a medical emergency. Know the signs and symptoms of a heart attack. Below are things women can do to reduce their risk for heart disease.   Do not smoke. If you smoke, quit.  Aim for a healthy weight. Being overweight causes many preventable deaths. Eat a healthy and balanced diet and drink an adequate amount of liquids.  Get moving. Make a commitment to be more physically active. Aim for 30 minutes of activity on most, if not all days of the week.  Eat for heart health. Choose a diet that is low in saturated fat and cholesterol and eliminate trans fat. Include whole grains, vegetables, and fruits. Read and understand the labels on food containers before buying.  Know your numbers. Ask your caregiver to check your blood pressure, cholesterol (total, HDL, LDL, triglycerides) and blood glucose. Work with your caregiver on improving your entire clinical picture.  High blood pressure. Limit or stop your table salt intake (try salt substitute and food seasonings). Avoid salty foods and drinks. Read labels on food containers before buying. Eating well and exercising can help control high blood pressure. STROKE  Stroke is a medical emergency. Stroke may be the result of a blood clot in a blood vessel in the brain or by a brain hemorrhage (bleeding). Know the signs and symptoms of a stroke. To lower the risk of developing a stroke:  Avoid fatty foods.  Quit smoking.  Control your diabetes, blood pressure, and irregular heart rate. THROMBOPHLEBITIS  (BLOOD CLOT) OF THE LEG  Becoming overweight and leading a stationary lifestyle may also contribute to developing blood clots. Controlling your diet and exercising will help lower the risk of developing blood clots. CANCER SCREENING  Breast Cancer: Take steps to reduce your risk of breast cancer.  You should practice "breast self-awareness." This means understanding the normal appearance and feel of your breasts and should include breast self-examination. Any changes detected, no matter how small, should be reported to your caregiver.  After age 40, you should have a clinical breast exam (CBE) every year.  Starting at age 40, you should consider having a mammogram (breast X-ray) every year.  If you have a family history of breast cancer, talk to your caregiver about genetic screening.  If you are at high risk for breast cancer, talk to your caregiver about having an MRI and a mammogram every year.  Intestinal or Stomach Cancer: Tests to consider are a rectal exam, fecal occult blood, sigmoidoscopy, and colonoscopy. Women who are high risk may need to be screened at an earlier age and more often.  Cervical Cancer:  Beginning at age 30, you should have a Pap test every 3 years as long as the past 3 Pap tests have been normal.  If you have had past treatment for cervical cancer or a condition that could lead to cancer, you need Pap tests and screening for cancer for at least 20 years after your treatment.  If you had a hysterectomy for a problem that was not cancer or a condition that could lead to cancer, then you no longer need Pap tests.    If you are between ages 65 and 70, and you have had normal Pap tests going back 10 years, you no longer need Pap tests.  If Pap tests have been discontinued, risk factors (such as a new sexual partner) need to be reassessed to determine if screening should be resumed.  Some medical problems can increase the chance of getting cervical cancer. In these  cases, your caregiver may recommend more frequent screening and Pap tests.  Uterine Cancer: If you have vaginal bleeding after reaching menopause, you should notify your caregiver.  Ovarian cancer: Other than yearly pelvic exams, there are no reliable tests available to screen for ovarian cancer at this time except for yearly pelvic exams.  Lung Cancer: Yearly chest X-rays can detect lung cancer and should be done on high risk women, such as cigarette smokers and women with chronic lung disease (emphysema).  Skin Cancer: A complete body skin exam should be done at your yearly examination. Avoid overexposure to the sun and ultraviolet light lamps. Use a strong sun block cream when in the sun. All of these things are important in lowering the risk of skin cancer. MENOPAUSE Menopause Symptoms: Hormone therapy products are effective for treating symptoms associated with menopause:  Moderate to severe hot flashes.  Night sweats.  Mood swings.  Headaches.  Tiredness.  Loss of sex drive.  Insomnia.  Other symptoms. Hormone replacement carries certain risks, especially in older women. Women who use or are thinking about using estrogen or estrogen with progestin treatments should discuss that with their caregiver. Your caregiver will help you understand the benefits and risks. The ideal dose of hormone replacement therapy is not known. The Food and Drug Administration (FDA) has concluded that hormone therapy should be used only at the lowest doses and for the shortest amount of time to reach treatment goals.  OSTEOPOROSIS Protecting Against Bone Loss and Preventing Fracture: If you use hormone therapy for prevention of bone loss (osteoporosis), the risks for bone loss must outweigh the risk of the therapy. Ask your caregiver about other medications known to be safe and effective for preventing bone loss and fractures. To guard against bone loss or fractures, the following is recommended:  If  you are less than age 50, take 1000 mg of calcium and at least 600 mg of Vitamin D per day.  If you are greater than age 50 but less than age 70, take 1200 mg of calcium and at least 600 mg of Vitamin D per day.  If you are greater than age 70, take 1200 mg of calcium and at least 800 mg of Vitamin D per day. Smoking and excessive alcohol intake increases the risk of osteoporosis. Eat foods rich in calcium and vitamin D and do weight bearing exercises several times a week as your caregiver suggests. DIABETES Diabetes Melitus: If you have Type I or Type 2 diabetes, you should keep your blood sugar under control with diet, exercise and recommended medication. Avoid too many sweets, starchy and fatty foods. Being overweight can make control more difficult. COGNITION AND MEMORY Cognition and Memory: Menopausal hormone therapy is not recommended for the prevention of cognitive disorders such as Alzheimer's disease or memory loss.  DEPRESSION  Depression may occur at any age, but is common in elderly women. The reasons may be because of physical, medical, social (loneliness), or financial problems and needs. If you are experiencing depression because of medical problems and control of symptoms, talk to your caregiver about this. Physical activity and   exercise may help with mood and sleep. Community and volunteer involvement may help your sense of value and worth. If you have depression and you feel that the problem is getting worse or becoming severe, talk to your caregiver about treatment options that are best for you. ACCIDENTS  Accidents are common and can be serious in the elderly woman. Prepare your house to prevent accidents. Eliminate throw rugs, place hand bars in the bath, shower and toilet areas. Avoid wearing high heeled shoes or walking on wet, snowy, and icy areas. Limit or stop driving if you have vision or hearing problems, or you feel you are unsteady with you movements and  reflexes. HEPATITIS C Hepatitis C is a type of viral infection affecting the liver. It is spread mainly through contact with blood from an infected person. It can be treated, but if left untreated, it can lead to severe liver damage over years. Many people who are infected do not know that the virus is in their blood. If you are a "baby-boomer", it is recommended that you have one screening test for Hepatitis C. IMMUNIZATIONS  Several immunizations are important to consider having during your senior years, including:   Tetanus, diptheria, and pertussis booster shot.  Influenza every year before the flu season begins.  Pneumonia vaccine.  Shingles vaccine.  Others as indicated based on your specific needs. Talk to your caregiver about these. Document Released: 06/15/2005 Document Revised: 04/09/2012 Document Reviewed: 02/09/2008 [employer] Patient Information 2014 [employer].  

## 2012-12-17 ENCOUNTER — Other Ambulatory Visit: Payer: Self-pay | Admitting: Gynecology

## 2012-12-17 DIAGNOSIS — M8580 Other specified disorders of bone density and structure, unspecified site: Secondary | ICD-10-CM

## 2013-03-10 ENCOUNTER — Ambulatory Visit (INDEPENDENT_AMBULATORY_CARE_PROVIDER_SITE_OTHER): Payer: BC Managed Care – PPO

## 2013-03-10 DIAGNOSIS — M899 Disorder of bone, unspecified: Secondary | ICD-10-CM

## 2013-03-10 DIAGNOSIS — M8580 Other specified disorders of bone density and structure, unspecified site: Secondary | ICD-10-CM

## 2013-03-11 ENCOUNTER — Encounter: Payer: Self-pay | Admitting: Gynecology

## 2013-11-17 ENCOUNTER — Encounter: Payer: Self-pay | Admitting: Women's Health

## 2013-12-10 ENCOUNTER — Encounter: Payer: Self-pay | Admitting: Women's Health

## 2013-12-10 ENCOUNTER — Ambulatory Visit (INDEPENDENT_AMBULATORY_CARE_PROVIDER_SITE_OTHER): Payer: BC Managed Care – PPO | Admitting: Women's Health

## 2013-12-10 VITALS — BP 122/82 | Ht 65.75 in | Wt 212.8 lb

## 2013-12-10 DIAGNOSIS — Z01419 Encounter for gynecological examination (general) (routine) without abnormal findings: Secondary | ICD-10-CM

## 2013-12-10 NOTE — Patient Instructions (Signed)
Health Recommendations for Postmenopausal Women Respected and ongoing research has looked at the most common causes of death, disability, and poor quality of life in postmenopausal women. The causes include heart disease, diseases of blood vessels, diabetes, depression, cancer, and bone loss (osteoporosis). Many things can be done to help lower the chances of developing these and other common problems. CARDIOVASCULAR DISEASE Heart Disease: A heart attack is a medical emergency. Know the signs and symptoms of a heart attack. Below are things women can do to reduce their risk for heart disease.   Do not smoke. If you smoke, quit.  Aim for a healthy weight. Being overweight causes many preventable deaths. Eat a healthy and balanced diet and drink an adequate amount of liquids.  Get moving. Make a commitment to be more physically active. Aim for 30 minutes of activity on most, if not all days of the week.  Eat for heart health. Choose a diet that is low in saturated fat and cholesterol and eliminate trans fat. Include whole grains, vegetables, and fruits. Read and understand the labels on food containers before buying.  Know your numbers. Ask your caregiver to check your blood pressure, cholesterol (total, HDL, LDL, triglycerides) and blood glucose. Work with your caregiver on improving your entire clinical picture.  High blood pressure. Limit or stop your table salt intake (try salt substitute and food seasonings). Avoid salty foods and drinks. Read labels on food containers before buying. Eating well and exercising can help control high blood pressure. STROKE  Stroke is a medical emergency. Stroke may be the result of a blood clot in a blood vessel in the brain or by a brain hemorrhage (bleeding). Know the signs and symptoms of a stroke. To lower the risk of developing a stroke:  Avoid fatty foods.  Quit smoking.  Control your diabetes, blood pressure, and irregular heart rate. THROMBOPHLEBITIS  (BLOOD CLOT) OF THE LEG  Becoming overweight and leading a stationary lifestyle may also contribute to developing blood clots. Controlling your diet and exercising will help lower the risk of developing blood clots. CANCER SCREENING  Breast Cancer: Take steps to reduce your risk of breast cancer.  You should practice "breast self-awareness." This means understanding the normal appearance and feel of your breasts and should include breast self-examination. Any changes detected, no matter how small, should be reported to your caregiver.  After age 40, you should have a clinical breast exam (CBE) every year.  Starting at age 40, you should consider having a mammogram (breast X-ray) every year.  If you have a family history of breast cancer, talk to your caregiver about genetic screening.  If you are at high risk for breast cancer, talk to your caregiver about having an MRI and a mammogram every year.  Intestinal or Stomach Cancer: Tests to consider are a rectal exam, fecal occult blood, sigmoidoscopy, and colonoscopy. Women who are high risk may need to be screened at an earlier age and more often.  Cervical Cancer:  Beginning at age 30, you should have a Pap test every 3 years as long as the past 3 Pap tests have been normal.  If you have had past treatment for cervical cancer or a condition that could lead to cancer, you need Pap tests and screening for cancer for at least 20 years after your treatment.  If you had a hysterectomy for a problem that was not cancer or a condition that could lead to cancer, then you no longer need Pap tests.    If you are between ages 65 and 70, and you have had normal Pap tests going back 10 years, you no longer need Pap tests.  If Pap tests have been discontinued, risk factors (such as a new sexual partner) need to be reassessed to determine if screening should be resumed.  Some medical problems can increase the chance of getting cervical cancer. In these  cases, your caregiver may recommend more frequent screening and Pap tests.  Uterine Cancer: If you have vaginal bleeding after reaching menopause, you should notify your caregiver.  Ovarian Cancer: Other than yearly pelvic exams, there are no reliable tests available to screen for ovarian cancer at this time except for yearly pelvic exams.  Lung Cancer: Yearly chest X-rays can detect lung cancer and should be done on high risk women, such as cigarette smokers and women with chronic lung disease (emphysema).  Skin Cancer: A complete body skin exam should be done at your yearly examination. Avoid overexposure to the sun and ultraviolet light lamps. Use a strong sun block cream when in the sun. All of these things are important for lowering the risk of skin cancer. MENOPAUSE Menopause Symptoms: Hormone therapy products are effective for treating symptoms associated with menopause:  Moderate to severe hot flashes.  Night sweats.  Mood swings.  Headaches.  Tiredness.  Loss of sex drive.  Insomnia.  Other symptoms. Hormone replacement carries certain risks, especially in older women. Women who use or are thinking about using estrogen or estrogen with progestin treatments should discuss that with their caregiver. Your caregiver will help you understand the benefits and risks. The ideal dose of hormone replacement therapy is not known. The Food and Drug Administration (FDA) has concluded that hormone therapy should be used only at the lowest doses and for the shortest amount of time to reach treatment goals.  OSTEOPOROSIS Protecting Against Bone Loss and Preventing Fracture If you use hormone therapy for prevention of bone loss (osteoporosis), the risks for bone loss must outweigh the risk of the therapy. Ask your caregiver about other medications known to be safe and effective for preventing bone loss and fractures. To guard against bone loss or fractures, the following is recommended:  If  you are younger than age 50, take 1000 mg of calcium and at least 600 mg of Vitamin D per day.  If you are older than age 50 but younger than age 70, take 1200 mg of calcium and at least 600 mg of Vitamin D per day.  If you are older than age 70, take 1200 mg of calcium and at least 800 mg of Vitamin D per day. Smoking and excessive alcohol intake increases the risk of osteoporosis. Eat foods rich in calcium and vitamin D and do weight bearing exercises several times a week as your caregiver suggests. DIABETES Diabetes Mellitus: If you have type I or type 2 diabetes, you should keep your blood sugar under control with diet, exercise, and recommended medication. Avoid starchy and fatty foods, and too many sweets. Being overweight can make diabetes control more difficult. COGNITION AND MEMORY Cognition and Memory: Menopausal hormone therapy is not recommended for the prevention of cognitive disorders such as Alzheimer's disease or memory loss.  DEPRESSION  Depression may occur at any age, but it is common in elderly women. This may be because of physical, medical, social (loneliness), or financial problems and needs. If you are experiencing depression because of medical problems and control of symptoms, talk to your caregiver about this. Physical   activity and exercise may help with mood and sleep. Community and volunteer involvement may improve your sense of value and worth. If you have depression and you feel that the problem is getting worse or becoming severe, talk to your caregiver about which treatment options are best for you. ACCIDENTS  Accidents are common and can be serious in elderly woman. Prepare your house to prevent accidents. Eliminate throw rugs, place hand bars in bath, shower, and toilet areas. Avoid wearing high heeled shoes or walking on wet, snowy, and icy areas. Limit or stop driving if you have vision or hearing problems, or if you feel you are unsteady with your movements and  reflexes. HEPATITIS C Hepatitis C is a type of viral infection affecting the liver. It is spread mainly through contact with blood from an infected person. It can be treated, but if left untreated, it can lead to severe liver damage over the years. Many people who are infected do not know that the virus is in their blood. If you are a "baby-boomer", it is recommended that you have one screening test for Hepatitis C. IMMUNIZATIONS  Several immunizations are important to consider having during your senior years, including:   Tetanus, diphtheria, and pertussis booster shot.  Influenza every year before the flu season begins.  Pneumonia vaccine.  Shingles vaccine.  Others, as indicated based on your specific needs. Talk to your caregiver about these. Document Released: 06/15/2005 Document Revised: 09/07/2013 Document Reviewed: 02/09/2008 [employer] Patient Information 2015 [employer]. This information is not intended to replace advice given to you by your health care provider. Make sure you discuss any questions you have with your health care provider.  

## 2013-12-10 NOTE — Progress Notes (Signed)
Breanna SailorsSheryl D Castro 1965/07/07 [id number]    History:    Presents for annual exam.  2010 LAVH with BSO for fibroids and menorrhagia. Normal Pap and mammogram history. 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 2014 DEXA T score -1.4 AP spine, hip average 1.1 FRAXS 2.5%/0.0%. Numerous hot flushes, declines HRT. 2010 negative colonoscopy. Hypertension primary care manages labs and meds.  Past medical history, past surgical history, family history and social history were all reviewed and documented in the EPIC chart. 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 Has 2 adopted daughters with ADHD, has 1. Stepdaughter, 1  Stepson. Parents- Hypertension.  ROS:  A  12 point ROS was performed and pertinent positives and negatives are included.  Exam:  Filed Vitals:   12/10/13 1418  BP: 122/82    General appearance:  Normal Thyroid:  Symmetrical, normal in size, without palpable masses or nodularity. Respiratory  Auscultation:  Clear without wheezing or rhonchi Cardiovascular  Auscultation:  Regular rate, without rubs, murmurs or gallops  Edema/varicosities:  Not grossly evident Abdominal  Soft,nontender, without masses, guarding or rebound.  Liver/spleen:  No organomegaly noted  Hernia:  None appreciated  Skin  Inspection:  Grossly normal   Breasts: Examined lying and sitting.     Right: Without masses, retractions, discharge or axillary adenopathy.     Left: Without masses, retractions, discharge or axillary adenopathy. Gentitourinary   Inguinal/mons:  Normal without inguinal adenopathy  External genitalia:  Normal  BUS/Urethra/Skene's glands:  Normal  Vagina:  Normal  Cervix:  Absent  Uterus:  Absent  Adnexa/parametria:     Rt: Without masses or tenderness.   Lt: Without masses or tenderness.  Anus and perineum: Normal  Digital rectal exam: Normal sphincter tone without palpated masses or tenderness  Assessment/Plan:  48 y.o. DBF G3P3 + 2  stepchildren and 2 adopted caughters for annual exam with no complaints.  Hypertension-primary care manages labs and meds LAVH with BSO for fibroids and menorrhagia Osteopenia  Plan: SBE's, continue annual mammogram, increase regular exercise, decrease calories for weight loss, vitamin D 2000 daily. Instructed to have vitamin D level checked next week at annual exam with primary care. Home safety and fall prevention discussed importance of regular weight bearing exercise reviewed. HRT reviewed for hot flashes, declines.   Note: This dictation was prepared with Dragon/digital dictation.  Any transcriptional errors that result are unintentional. Harrington ChallengerYOUNG,NANCY J Behavioral Health HospitalWHNP, 5:23 PM 12/10/2013

## 2013-12-15 ENCOUNTER — Encounter: Payer: Self-pay | Admitting: Gynecology

## 2014-03-08 ENCOUNTER — Encounter: Payer: Self-pay | Admitting: Women's Health

## 2014-11-23 ENCOUNTER — Encounter: Payer: Self-pay | Admitting: Women's Health

## 2014-12-13 ENCOUNTER — Ambulatory Visit (INDEPENDENT_AMBULATORY_CARE_PROVIDER_SITE_OTHER): Payer: BC Managed Care – PPO | Admitting: Women's Health

## 2014-12-13 ENCOUNTER — Encounter: Payer: Self-pay | Admitting: Women's Health

## 2014-12-13 VITALS — BP 122/78 | Ht 66.0 in | Wt 216.0 lb

## 2014-12-13 DIAGNOSIS — Z13820 Encounter for screening for osteoporosis: Secondary | ICD-10-CM

## 2014-12-13 DIAGNOSIS — Z01419 Encounter for gynecological examination (general) (routine) without abnormal findings: Secondary | ICD-10-CM

## 2014-12-13 DIAGNOSIS — M8580 Other specified disorders of bone density and structure, unspecified site: Secondary | ICD-10-CM | POA: Diagnosis not present

## 2014-12-13 NOTE — Progress Notes (Signed)
Breanna Castro [birth year] [id number]    History:    Presents for annual exam.  2010 LAVH with BSO for fibroids and menorrhagia on no HRT. Normal Pap and mammogram history. 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 2014 T score -1.4 at spine, 1.1 and hip average FRAX 2.5%/0.0%. Negative colonoscopy age 49. Hypertension primary care manages labs and meds  Past medical history, past surgical history, family history and social history were all reviewed and documented in the EPIC chart. 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 Has 3 daughters, 2 adopted daughters, one stepdaughter 1 stepson.  ROS:  A ROS was performed and pertinent positives and negatives are included.  Exam:  Filed Vitals:   12/13/14 1208  BP: 122/78    General appearance:  Normal Thyroid:  Symmetrical, normal in size, without palpable masses or nodularity. Respiratory  Auscultation:  Clear without wheezing or rhonchi Cardiovascular  Auscultation:  Regular rate, without rubs, murmurs or gallops  Edema/varicosities:  Not grossly evident Abdominal  Soft,nontender, without masses, guarding or rebound.  Liver/spleen:  No organomegaly noted  Hernia:  None appreciated  Skin  Inspection:  Grossly normal   Breasts: Examined lying and sitting.     Right: Without masses, retractions, discharge or axillary adenopathy.     Left: Without masses, retractions, discharge or axillary adenopathy. Gentitourinary   Inguinal/mons:  Normal without inguinal adenopathy  External genitalia:  Normal  BUS/Urethra/Skene's glands:  Normal  Vagina:  Normal  Cervix:  Uterus absent  Adnexa/parametria:     Rt: Without masses or tenderness.   Lt: Without masses or tenderness.  Anus and perineum: Normal  Digital rectal exam: Normal sphincter tone without palpated masses or tenderness  Assessment/Plan:  49 y.o. DBF G3 P3 for annual exam with no complaints.  2010 LAVH with BSO on no HRT Osteopenia without elevated FRAX Hypertension primary care  manages labs and meds  Plan: SBE's, continue annual screening mammogram, calcium rich diet, vitamin D 2000 daily encouraged. Reviewed importance of home safety, fall prevention and importance of weightbearing exercise. Repeat DEXA this year to check stability. Condoms encouraged if sexually active. UAHarrington Challenger WHNP, 5:25 PM 12/13/2014

## 2014-12-13 NOTE — Patient Instructions (Signed)

## 2014-12-14 LAB — URINALYSIS W MICROSCOPIC + REFLEX CULTURE
Bilirubin Urine: NEGATIVE
CRYSTALS: NONE SEEN [HPF]
Glucose, UA: NEGATIVE
KETONES UR: NEGATIVE
NITRITE: NEGATIVE
Protein, ur: NEGATIVE
SPECIFIC GRAVITY, URINE: 1.016 (ref 1.001–1.035)
Yeast: NONE SEEN [HPF]
pH: 6 (ref 5.0–8.0)

## 2014-12-15 LAB — URINE CULTURE

## 2015-03-08 DIAGNOSIS — M8580 Other specified disorders of bone density and structure, unspecified site: Secondary | ICD-10-CM

## 2015-03-08 HISTORY — DX: Other specified disorders of bone density and structure, unspecified site: M85.80

## 2015-03-28 ENCOUNTER — Other Ambulatory Visit: Payer: Self-pay | Admitting: Gynecology

## 2015-03-28 ENCOUNTER — Ambulatory Visit (INDEPENDENT_AMBULATORY_CARE_PROVIDER_SITE_OTHER): Payer: BC Managed Care – PPO

## 2015-03-28 DIAGNOSIS — M899 Disorder of bone, unspecified: Secondary | ICD-10-CM

## 2015-03-28 DIAGNOSIS — M8580 Other specified disorders of bone density and structure, unspecified site: Secondary | ICD-10-CM

## 2015-03-28 DIAGNOSIS — Z13820 Encounter for screening for osteoporosis: Secondary | ICD-10-CM

## 2015-03-28 DIAGNOSIS — M8589 Other specified disorders of bone density and structure, multiple sites: Secondary | ICD-10-CM

## 2015-03-29 ENCOUNTER — Encounter: Payer: Self-pay | Admitting: Gynecology

## 2015-12-02 ENCOUNTER — Encounter: Payer: Self-pay | Admitting: Women's Health

## 2016-01-13 ENCOUNTER — Ambulatory Visit (INDEPENDENT_AMBULATORY_CARE_PROVIDER_SITE_OTHER): Payer: BC Managed Care – PPO | Admitting: Women's Health

## 2016-01-13 ENCOUNTER — Encounter: Payer: Self-pay | Admitting: Women's Health

## 2016-01-13 VITALS — BP 120/82 | Ht 66.0 in | Wt 202.0 lb

## 2016-01-13 DIAGNOSIS — Z01419 Encounter for gynecological examination (general) (routine) without abnormal findings: Secondary | ICD-10-CM

## 2016-01-13 NOTE — Patient Instructions (Signed)
Hypertension Hypertension, commonly called high blood pressure, is when the force of blood pumping through your arteries is too strong. Your arteries are the blood vessels that carry blood from your heart throughout your body. A blood pressure reading consists of a higher number over a lower number, such as 110/72. The higher number (systolic) is the pressure inside your arteries when your heart pumps. The lower number (diastolic) is the pressure inside your arteries when your heart relaxes. Ideally you want your blood pressure below 120/80. Hypertension forces your heart to work harder to pump blood. Your arteries may become narrow or stiff. Having untreated or uncontrolled hypertension can cause heart attack, stroke, kidney disease, and other problems. RISK FACTORS Some risk factors for high blood pressure are controllable. Others are not.  Risk factors you cannot control include:   Race. You may be at higher risk if you are African American.  Age. Risk increases with age.  Gender. Men are at higher risk than women before age 50 years. After age 25, women are at higher risk than men. Risk factors you can control include:  Not getting enough exercise or physical activity.  Being overweight.  Getting too much fat, sugar, calories, or salt in your diet.  Drinking too much alcohol. SIGNS AND SYMPTOMS Hypertension does not usually cause signs or symptoms. Extremely high blood pressure (hypertensive crisis) may cause headache, anxiety, shortness of breath, and nosebleed. DIAGNOSIS To check if you have hypertension, your health care provider will measure your blood pressure while you are seated, with your arm held at the level of your heart. It should be measured at least twice using the same arm. Certain conditions can cause a difference in blood pressure between your right and left arms. A blood pressure reading that is higher than normal on one occasion does not mean that you need treatment. If  it is not clear whether you have high blood pressure, you may be asked to return on a different day to have your blood pressure checked again. Or, you may be asked to monitor your blood pressure at home for 1 or more weeks. TREATMENT Treating high blood pressure includes making lifestyle changes and possibly taking medicine. Living a healthy lifestyle can help lower high blood pressure. You may need to change some of your habits. Lifestyle changes may include:  Following the DASH diet. This diet is high in fruits, vegetables, and whole grains. It is low in salt, red meat, and added sugars.  Keep your sodium intake below 2,300 mg per day.  Getting at least 30-45 minutes of aerobic exercise at least 4 times per week.  Losing weight if necessary.  Not smoking.  Limiting alcoholic beverages.  Learning ways to reduce stress. Your health care provider may prescribe medicine if lifestyle changes are not enough to get your blood pressure under control, and if one of the following is true:  You are 62-65 years of age and your systolic blood pressure is above 140.  You are 6 years of age or older, and your systolic blood pressure is above 150.  Your diastolic blood pressure is above 90.  You have diabetes, and your systolic blood pressure is over 160 or your diastolic blood pressure is over 90.  You have kidney disease and your blood pressure is above 140/90.  You have heart disease and your blood pressure is above 140/90. Your personal target blood pressure may vary depending on your medical conditions, your age, and other factors. HOME CARE INSTRUCTIONS  Have your blood pressure rechecked as directed by your health care provider.   Take medicines only as directed by your health care provider. Follow the directions carefully. Blood pressure medicines must be taken as prescribed. The medicine does not work as well when you skip doses. Skipping doses also puts you at risk for  problems.  Do not smoke.   Monitor your blood pressure at home as directed by your health care provider. SEEK MEDICAL CARE IF:   You think you are having a reaction to medicines taken.  You have recurrent headaches or feel dizzy.  You have swelling in your ankles.  You have trouble with your vision. SEEK IMMEDIATE MEDICAL CARE IF:  You develop a severe headache or confusion.  You have unusual weakness, numbness, or feel faint.  You have severe chest or abdominal pain.  You vomit repeatedly.  You have trouble breathing. MAKE SURE YOU:   Understand these instructions.  Will watch your condition.  Will get help right away if you are not doing well or get worse.   This information is not intended to replace advice given to you by your health care provider. Make sure you discuss any questions you have with your health care provider.   Document Released: 04/23/2005 Document Revised: 09/07/2014 Document Reviewed: 02/13/2013 Elsevier Interactive Patient Education [employer]. Menopause is a normal process in which your reproductive ability comes to an end. This process happens gradually over a span of months to years, usually between the ages of 32 and 6. Menopause is complete when you have missed 12 consecutive menstrual periods. It is important to talk with your health care provider about some of the most common conditions that affect postmenopausal women, such as heart disease, cancer, and bone loss (osteoporosis). Adopting a healthy lifestyle and getting preventive care can help to promote your health and wellness. Those actions can also lower your chances of developing some of these common conditions. WHAT SHOULD I KNOW ABOUT MENOPAUSE? During menopause, you may experience a number of symptoms, such as:  Moderate-to-severe hot flashes.  Night sweats.  Decrease in sex drive.  Mood swings.  Headaches.  Tiredness.  Irritability.  Memory  problems.  Insomnia. Choosing to treat or not to treat menopausal changes is an individual decision that you make with your health care provider. WHAT SHOULD I KNOW ABOUT HORMONE REPLACEMENT THERAPY AND SUPPLEMENTS? Hormone therapy products are effective for treating symptoms that are associated with menopause, such as hot flashes and night sweats. Hormone replacement carries certain risks, especially as you become older. If you are thinking about using estrogen or estrogen with progestin treatments, discuss the benefits and risks with your health care provider. WHAT SHOULD I KNOW ABOUT HEART DISEASE AND STROKE? Heart disease, heart attack, and stroke become more likely as you age. This may be due, in part, to the hormonal changes that your body experiences during menopause. These can affect how your body processes dietary fats, triglycerides, and cholesterol. Heart attack and stroke are both medical emergencies. There are many things that you can do to help prevent heart disease and stroke:  Have your blood pressure checked at least every 1-2 years. High blood pressure causes heart disease and increases the risk of stroke.  If you are 73-29 years old, ask your health care provider if you should take aspirin to prevent a heart attack or a stroke.  Do not use any tobacco products, including cigarettes, chewing tobacco, or electronic cigarettes. If you need help quitting,  ask your health care provider.  It is important to eat a healthy diet and maintain a healthy weight.  Be sure to include plenty of vegetables, fruits, low-fat dairy products, and lean protein.  Avoid eating foods that are high in solid fats, added sugars, or salt (sodium).  Get regular exercise. This is one of the most important things that you can do for your health.  Try to exercise for at least 150 minutes each week. The type of exercise that you do should increase your heart rate and make you sweat. This is known as  moderate-intensity exercise.  Try to do strengthening exercises at least twice each week. Do these in addition to the moderate-intensity exercise.  Know your numbers.Ask your health care provider to check your cholesterol and your blood glucose. Continue to have your blood tested as directed by your health care provider. WHAT SHOULD I KNOW ABOUT CANCER SCREENING? There are several types of cancer. Take the following steps to reduce your risk and to catch any cancer development as early as possible. Breast Cancer  Practice breast self-awareness.  This means understanding how your breasts normally appear and feel.  It also means doing regular breast self-exams. Let your health care provider know about any changes, no matter how small.  If you are 87 or older, have a clinician do a breast exam (clinical breast exam or CBE) every year. Depending on your age, family history, and medical history, it may be recommended that you also have a yearly breast X-ray (mammogram).  If you have a family history of breast cancer, talk with your health care provider about genetic screening.  If you are at high risk for breast cancer, talk with your health care provider about having an MRI and a mammogram every year.  Breast cancer (BRCA) gene test is recommended for women who have family members with BRCA-related cancers. Results of the assessment will determine the need for genetic counseling and BRCA1 and for BRCA2 testing. BRCA-related cancers include these types:  Breast. This occurs in males or females.  Ovarian.  Tubal. This may also be called fallopian tube cancer.  Cancer of the abdominal or pelvic lining (peritoneal cancer).  Prostate.  Pancreatic. Cervical, Uterine, and Ovarian Cancer Your health care provider may recommend that you be screened regularly for cancer of the pelvic organs. These include your ovaries, uterus, and vagina. This screening involves a pelvic exam, which includes  checking for microscopic changes to the surface of your cervix (Pap test).  For women ages 21-65, health care providers may recommend a pelvic exam and a Pap test every three years. For women ages 4-65, they may recommend the Pap test and pelvic exam, combined with testing for human papilloma virus (HPV), every five years. Some types of HPV increase your risk of cervical cancer. Testing for HPV may also be done on women of any age who have unclear Pap test results.  Other health care providers may not recommend any screening for nonpregnant women who are considered low risk for pelvic cancer and have no symptoms. Ask your health care provider if a screening pelvic exam is right for you.  If you have had past treatment for cervical cancer or a condition that could lead to cancer, you need Pap tests and screening for cancer for at least 20 years after your treatment. If Pap tests have been discontinued for you, your risk factors (such as having a new sexual partner) need to be reassessed to determine if you  should start having screenings again. Some women have medical problems that increase the chance of getting cervical cancer. In these cases, your health care provider may recommend that you have screening and Pap tests more often.  If you have a family history of uterine cancer or ovarian cancer, talk with your health care provider about genetic screening.  If you have vaginal bleeding after reaching menopause, tell your health care provider.  There are currently no reliable tests available to screen for ovarian cancer. Lung Cancer Lung cancer screening is recommended for adults 59-77 years old who are at high risk for lung cancer because of a history of smoking. A yearly low-dose CT scan of the lungs is recommended if you:  Currently smoke.  Have a history of at least 30 pack-years of smoking and you currently smoke or have quit within the past 15 years. A pack-year is smoking an average of one  pack of cigarettes per day for one year. Yearly screening should:  Continue until it has been 15 years since you quit.  Stop if you develop a health problem that would prevent you from having lung cancer treatment. Colorectal Cancer  This type of cancer can be detected and can often be prevented.  Routine colorectal cancer screening usually begins at age 57 and continues through age 48.  If you have risk factors for colon cancer, your health care provider may recommend that you be screened at an earlier age.  If you have a family history of colorectal cancer, talk with your health care provider about genetic screening.  Your health care provider may also recommend using home test kits to check for hidden blood in your stool.  A small camera at the end of a tube can be used to examine your colon directly (sigmoidoscopy or colonoscopy). This is done to check for the earliest forms of colorectal cancer.  Direct examination of the colon should be repeated every 5-10 years until age 30. However, if early forms of precancerous polyps or small growths are found or if you have a family history or genetic risk for colorectal cancer, you may need to be screened more often. Skin Cancer  Check your skin from head to toe regularly.  Monitor any moles. Be sure to tell your health care provider:  About any new moles or changes in moles, especially if there is a change in a mole's shape or color.  If you have a mole that is larger than the size of a pencil eraser.  If any of your family members has a history of skin cancer, especially at a young age, talk with your health care provider about genetic screening.  Always use sunscreen. Apply sunscreen liberally and repeatedly throughout the day.  Whenever you are outside, protect yourself by wearing long sleeves, pants, a wide-brimmed hat, and sunglasses. WHAT SHOULD I KNOW ABOUT OSTEOPOROSIS? Osteoporosis is a condition in which bone destruction  happens more quickly than new bone creation. After menopause, you may be at an increased risk for osteoporosis. To help prevent osteoporosis or the bone fractures that can happen because of osteoporosis, the following is recommended:  If you are 60-34 years old, get at least 1,000 mg of calcium and at least 600 mg of vitamin D per day.  If you are older than age 85 but younger than age 59, get at least 1,200 mg of calcium and at least 600 mg of vitamin D per day.  If you are older than age 78, get  at least 1,200 mg of calcium and at least 800 mg of vitamin D per day. Smoking and excessive alcohol intake increase the risk of osteoporosis. Eat foods that are rich in calcium and vitamin D, and do weight-bearing exercises several times each week as directed by your health care provider. WHAT SHOULD I KNOW ABOUT HOW MENOPAUSE AFFECTS [place]? Depression may occur at any age, but it is more common as you become older. Common symptoms of depression include:  Low or sad mood.  Changes in sleep patterns.  Changes in appetite or eating patterns.  Feeling an overall lack of motivation or enjoyment of activities that you previously enjoyed.  Frequent crying spells. Talk with your health care provider if you think that you are experiencing depression. WHAT SHOULD I KNOW ABOUT IMMUNIZATIONS? It is important that you get and maintain your immunizations. These include:  Tetanus, diphtheria, and pertussis (Tdap) booster vaccine.  Influenza every year before the flu season begins.  Pneumonia vaccine.  Shingles vaccine. Your health care provider may also recommend other immunizations.   This information is not intended to replace advice given to you by your health care provider. Make sure you discuss any questions you have with your health care provider.   Document Released: 06/15/2005 Document Revised: 05/14/2014 Document Reviewed: 12/24/2013 Elsevier Interactive Patient Education [employer]

## 2016-01-13 NOTE — Progress Notes (Signed)
Breanna SailorsSheryl D Castro 1965/07/11 [id number]    History:    Presents for annual exam. 2010 LAVH with BSO for fibroids and menorrhagia, not on HRT. Normal Pap and mammogram history. 2016 DEXA scan improved from last year, spine slightly osteopenic, -1.3, hip average +1.4, FRAX 2.8%/ hip 0%. 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 Hypertension controlled on medications, managed by PCP. 2010 colonoscopy normal. Walking several miles a day for exercise. Taking multivitamin with vitamin D.   Past medical history, past surgical history, family history and social history were all reviewed and documented in the EPIC chart. 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 Has 3 daughters, 2 adopted daughters, one stepdaughter 1 stepson.  ROS:  A ROS was performed and pertinent positives and negatives are included.  Exam:  Vitals:   01/13/16 1630  BP: 120/82  Weight: 202 lb (91.6 kg)  Height: 5\' 6"  (1.676 m)   Body mass index is 32.6 kg/m.   General appearance:  Normal Thyroid:  Symmetrical, normal in size, without palpable masses or nodularity. Respiratory  Auscultation:  Clear without wheezing or rhonchi Cardiovascular  Auscultation:  Regular rate, without rubs, murmurs or gallops  Edema/varicosities:  Not grossly evident Abdominal  Soft,nontender, without masses, guarding or rebound.  Liver/spleen:  No organomegaly noted  Hernia:  None appreciated  Skin  Inspection:  Grossly normal   Breasts: Examined lying and sitting.    Right: Fibrous, without masses, retractions,discharge or axillary adenopathy.    Left: Fibrous, without masses, retractions, discharge or axillary adenopathy. Scar from fibroadenoma removal noted. Gentitourinary   Inguinal/mons:  Normal without inguinal adenopathy  External genitalia:  Normal  BUS/Urethra/Skene's glands:  Normal  Vagina:  Normal  Cervix:  And uterus absent.  Adnexa/parametria:  Absent  Anus and  perineum: Normal  Digital rectal exam: Normal sphincter tone without palpated masses or tenderness  Assessment/Plan:  50 y.o. DBF G3P3 for annual exam without complaints  2010 LAVH with BSO/no HRT Osteopenia -- spine, improved from 2014 HTN -- labs and meds managed by PCP  Plan: Menopausal symptoms reviewed, denies need for HRT at this time. Reviewed improved DEXA results, continue exercise and multivitamin. HTN well controlled on medications, advised to continue exercise and healthy diet. Continue annual screening mammogram and SBE's. Encouraged condom use if becoming sexually active. Encouraged  leisure. UHarrington Castro.    Breanna Castro J WHNP, 4:55 PM 01/13/2016

## 2016-01-14 LAB — URINALYSIS W MICROSCOPIC + REFLEX CULTURE
BACTERIA UA: NONE SEEN [HPF]
Bilirubin Urine: NEGATIVE
Casts: NONE SEEN [LPF]
Crystals: NONE SEEN [HPF]
GLUCOSE, UA: NEGATIVE
Ketones, ur: NEGATIVE
Nitrite: NEGATIVE
PROTEIN: NEGATIVE
Specific Gravity, Urine: 1.026 (ref 1.001–1.035)
Yeast: NONE SEEN [HPF]
pH: 6 (ref 5.0–8.0)

## 2016-01-15 LAB — URINE CULTURE: ORGANISM ID, BACTERIA: NO GROWTH

## 2016-11-21 ENCOUNTER — Encounter: Payer: Self-pay | Admitting: Women's Health

## 2017-01-28 ENCOUNTER — Encounter: Payer: BC Managed Care – PPO | Admitting: Women's Health

## 2017-03-04 ENCOUNTER — Ambulatory Visit (INDEPENDENT_AMBULATORY_CARE_PROVIDER_SITE_OTHER): Payer: BC Managed Care – PPO | Admitting: Women's Health

## 2017-03-04 ENCOUNTER — Encounter: Payer: Self-pay | Admitting: Women's Health

## 2017-03-04 VITALS — BP 128/80 | Ht 65.0 in | Wt 205.0 lb

## 2017-03-04 DIAGNOSIS — Z01419 Encounter for gynecological examination (general) (routine) without abnormal findings: Secondary | ICD-10-CM | POA: Diagnosis not present

## 2017-03-04 NOTE — Patient Instructions (Signed)
Health Maintenance for Postmenopausal Women Menopause is a normal process in which your reproductive ability comes to an end. This process happens gradually over a span of months to years, usually between the ages of 22 and 9. Menopause is complete when you have missed 12 consecutive menstrual periods. It is important to talk with your health care provider about some of the most common conditions that affect postmenopausal women, such as heart disease, cancer, and bone loss (osteoporosis). Adopting a healthy lifestyle and getting preventive care can help to promote your health and wellness. Those actions can also lower your chances of developing some of these common conditions. What should I know about menopause? During menopause, you may experience a number of symptoms, such as:  Moderate-to-severe hot flashes.  Night sweats.  Decrease in sex drive.  Mood swings.  Headaches.  Tiredness.  Irritability.  Memory problems.  Insomnia.  Choosing to treat or not to treat menopausal changes is an individual decision that you make with your health care provider. What should I know about hormone replacement therapy and supplements? Hormone therapy products are effective for treating symptoms that are associated with menopause, such as hot flashes and night sweats. Hormone replacement carries certain risks, especially as you become older. If you are thinking about using estrogen or estrogen with progestin treatments, discuss the benefits and risks with your health care provider. What should I know about heart disease and stroke? Heart disease, heart attack, and stroke become more likely as you age. This may be due, in part, to the hormonal changes that your body experiences during menopause. These can affect how your body processes dietary fats, triglycerides, and cholesterol. Heart attack and stroke are both medical emergencies. There are many things that you can do to help prevent heart disease  and stroke:  Have your blood pressure checked at least every 1-2 years. High blood pressure causes heart disease and increases the risk of stroke.  If you are 53-22 years old, ask your health care provider if you should take aspirin to prevent a heart attack or a stroke.  Do not use any tobacco products, including cigarettes, chewing tobacco, or electronic cigarettes. If you need help quitting, ask your health care provider.  It is important to eat a healthy diet and maintain a healthy weight. ? Be sure to include plenty of vegetables, fruits, low-fat dairy products, and lean protein. ? Avoid eating foods that are high in solid fats, added sugars, or salt (sodium).  Get regular exercise. This is one of the most important things that you can do for your health. ? Try to exercise for at least 150 minutes each week. The type of exercise that you do should increase your heart rate and make you sweat. This is known as moderate-intensity exercise. ? Try to do strengthening exercises at least twice each week. Do these in addition to the moderate-intensity exercise.  Know your numbers.Ask your health care provider to check your cholesterol and your blood glucose. Continue to have your blood tested as directed by your health care provider.  What should I know about cancer screening? There are several types of cancer. Take the following steps to reduce your risk and to catch any cancer development as early as possible. Breast Cancer  Practice breast self-awareness. ? This means understanding how your breasts normally appear and feel. ? It also means doing regular breast self-exams. Let your health care provider know about any changes, no matter how small.  If you are 40  or older, have a clinician do a breast exam (clinical breast exam or CBE) every year. Depending on your age, family history, and medical history, it may be recommended that you also have a yearly breast X-ray (mammogram).  If you  have a family history of breast cancer, talk with your health care provider about genetic screening.  If you are at high risk for breast cancer, talk with your health care provider about having an MRI and a mammogram every year.  Breast cancer (BRCA) gene test is recommended for women who have family members with BRCA-related cancers. Results of the assessment will determine the need for genetic counseling and BRCA1 and for BRCA2 testing. BRCA-related cancers include these types: ? Breast. This occurs in males or females. ? Ovarian. ? Tubal. This may also be called fallopian tube cancer. ? Cancer of the abdominal or pelvic lining (peritoneal cancer). ? Prostate. ? Pancreatic.  Cervical, Uterine, and Ovarian Cancer Your health care provider may recommend that you be screened regularly for cancer of the pelvic organs. These include your ovaries, uterus, and vagina. This screening involves a pelvic exam, which includes checking for microscopic changes to the surface of your cervix (Pap test).  For women ages 21-65, health care providers may recommend a pelvic exam and a Pap test every three years. For women ages 79-65, they may recommend the Pap test and pelvic exam, combined with testing for human papilloma virus (HPV), every five years. Some types of HPV increase your risk of cervical cancer. Testing for HPV may also be done on women of any age who have unclear Pap test results.  Other health care providers may not recommend any screening for nonpregnant women who are considered low risk for pelvic cancer and have no symptoms. Ask your health care provider if a screening pelvic exam is right for you.  If you have had past treatment for cervical cancer or a condition that could lead to cancer, you need Pap tests and screening for cancer for at least 20 years after your treatment. If Pap tests have been discontinued for you, your risk factors (such as having a new sexual partner) need to be  reassessed to determine if you should start having screenings again. Some women have medical problems that increase the chance of getting cervical cancer. In these cases, your health care provider may recommend that you have screening and Pap tests more often.  If you have a family history of uterine cancer or ovarian cancer, talk with your health care provider about genetic screening.  If you have vaginal bleeding after reaching menopause, tell your health care provider.  There are currently no reliable tests available to screen for ovarian cancer.  Lung Cancer Lung cancer screening is recommended for adults 69-62 years old who are at high risk for lung cancer because of a history of smoking. A yearly low-dose CT scan of the lungs is recommended if you:  Currently smoke.  Have a history of at least 30 pack-years of smoking and you currently smoke or have quit within the past 15 years. A pack-year is smoking an average of one pack of cigarettes per day for one year.  Yearly screening should:  Continue until it has been 15 years since you quit.  Stop if you develop a health problem that would prevent you from having lung cancer treatment.  Colorectal Cancer  This type of cancer can be detected and can often be prevented.  Routine colorectal cancer screening usually begins at  age 42 and continues through age 45.  If you have risk factors for colon cancer, your health care provider may recommend that you be screened at an earlier age.  If you have a family history of colorectal cancer, talk with your health care provider about genetic screening.  Your health care provider may also recommend using home test kits to check for hidden blood in your stool.  A small camera at the end of a tube can be used to examine your colon directly (sigmoidoscopy or colonoscopy). This is done to check for the earliest forms of colorectal cancer.  Direct examination of the colon should be repeated every  5-10 years until age 71. However, if early forms of precancerous polyps or small growths are found or if you have a family history or genetic risk for colorectal cancer, you may need to be screened more often.  Skin Cancer  Check your skin from head to toe regularly.  Monitor any moles. Be sure to tell your health care provider: ? About any new moles or changes in moles, especially if there is a change in a mole's shape or color. ? If you have a mole that is larger than the size of a pencil eraser.  If any of your family members has a history of skin cancer, especially at a Winona Sison age, talk with your health care provider about genetic screening.  Always use sunscreen. Apply sunscreen liberally and repeatedly throughout the day.  Whenever you are outside, protect yourself by wearing long sleeves, pants, a wide-brimmed hat, and sunglasses.  What should I know about osteoporosis? Osteoporosis is a condition in which bone destruction happens more quickly than new bone creation. After menopause, you may be at an increased risk for osteoporosis. To help prevent osteoporosis or the bone fractures that can happen because of osteoporosis, the following is recommended:  If you are 46-71 years old, get at least 1,000 mg of calcium and at least 600 mg of vitamin D per day.  If you are older than age 55 but younger than age 65, get at least 1,200 mg of calcium and at least 600 mg of vitamin D per day.  If you are older than age 54, get at least 1,200 mg of calcium and at least 800 mg of vitamin D per day.  Smoking and excessive alcohol intake increase the risk of osteoporosis. Eat foods that are rich in calcium and vitamin D, and do weight-bearing exercises several times each week as directed by your health care provider. What should I know about how menopause affects my mental health? Depression may occur at any age, but it is more common as you become older. Common symptoms of depression  include:  Low or sad mood.  Changes in sleep patterns.  Changes in appetite or eating patterns.  Feeling an overall lack of motivation or enjoyment of activities that you previously enjoyed.  Frequent crying spells.  Talk with your health care provider if you think that you are experiencing depression. What should I know about immunizations? It is important that you get and maintain your immunizations. These include:  Tetanus, diphtheria, and pertussis (Tdap) booster vaccine.  Influenza every year before the flu season begins.  Pneumonia vaccine.  Shingles vaccine.  Your health care provider may also recommend other immunizations. This information is not intended to replace advice given to you by your health care provider. Make sure you discuss any questions you have with your health care provider. Document Released: 06/15/2005  Document Revised: 11/11/2015 Document Reviewed: 01/25/2015 Elsevier Interactive Patient Education  2018 [employer]  

## 2017-03-04 NOTE — Progress Notes (Signed)
Trey SailorsSheryl D Lambertson [birth year] [id number]    History:    Presents for annual exam.  2010 LAVH with BSO for fibroids and menorrhagia. Normal Pap and mammogram history. 2016 T score -1.3 at spine and hip average +1.4 FRAX 2.8%/0%. 2009 left fibroadenoma removed. 2010 negative colonoscopy. Primary care manages hypertension.  Past medical history, past surgical history, family history and social history were all reviewed and documented in the EPIC chart. Pre-K teacher. Has 3 children, one  Is a Investment banker, operationalchef, one is an Pensions consultantattorney, 1 works for the [employer], 2 stepchildren that she remains in close contact with as well as 2 adopted children.  ROS:  A ROS was performed and pertinent positives and negatives are included.  Exam:  Vitals:   03/04/17 1623  BP: 128/80  Weight: 205 lb (93 kg)  Height: 5\' 5"  (1.651 m)   Body mass index is 34.11 kg/m.   General appearance:  Normal Thyroid:  Symmetrical, normal in size, without palpable masses or nodularity. Respiratory  Auscultation:  Clear without wheezing or rhonchi Cardiovascular  Auscultation:  Regular rate, without rubs, murmurs or gallops  Edema/varicosities:  Not grossly evident Abdominal  Soft,nontender, without masses, guarding or rebound.  Liver/spleen:  No organomegaly noted  Hernia:  None appreciated  Skin  Inspection:  Grossly normal   Breasts: Examined lying and sitting.     Right: Without masses, retractions, discharge or axillary adenopathy.     Left: Without masses, retractions, discharge or axillary adenopathy. Gentitourinary   Inguinal/mons:  Normal without inguinal adenopathy  External genitalia:  Normal  BUS/Urethra/Skene's glands:  Normal  Vagina:  Normal  Cervix:  Uterus absent  Adnexa/parametria:     Rt: Without masses or tenderness.   Lt: Without masses or tenderness.  Anus and perineum: Normal  Digital rectal exam: Normal sphincter tone without palpated masses or tenderness  Assessment/Plan:  51 y.o. DBF G3 P3 +2  adopted, +2 step for annual exam with no complaints.  2010 LAVH with BSO for fibroids on no HRT Osteopenia without elevated FRAX Hypertension-primary care manages labs and meds Obesity  Plan: SBE's, continue annual screening mammogram, calcium rich diet, vitamin D 2000 daily encouraged. Reviewed importance of home safety, fall prevention and weight bearing exercise. Repeat DEXA. Encouraged to decrease calories/carbs and increase exercise for weight loss.    Harrington Challengerancy J Daveda Larock [facility] CenterWHNP, 5:04 PM 03/04/2017

## 2017-11-25 ENCOUNTER — Encounter: Payer: Self-pay | Admitting: Women's Health

## 2018-03-05 ENCOUNTER — Ambulatory Visit: Payer: BC Managed Care – PPO | Admitting: Women's Health

## 2018-03-05 ENCOUNTER — Encounter: Payer: Self-pay | Admitting: Women's Health

## 2018-03-05 VITALS — BP 126/80 | Ht 65.0 in | Wt 213.0 lb

## 2018-03-05 DIAGNOSIS — Z113 Encounter for screening for infections with a predominantly sexual mode of transmission: Secondary | ICD-10-CM

## 2018-03-05 DIAGNOSIS — Z01419 Encounter for gynecological examination (general) (routine) without abnormal findings: Secondary | ICD-10-CM | POA: Diagnosis not present

## 2018-03-05 DIAGNOSIS — Z13820 Encounter for screening for osteoporosis: Secondary | ICD-10-CM | POA: Diagnosis not present

## 2018-03-05 NOTE — Patient Instructions (Signed)
Health Maintenance for Postmenopausal Women Menopause is a normal process in which your reproductive ability comes to an end. This process happens gradually over a span of months to years, usually between the ages of 22 and 9. Menopause is complete when you have missed 12 consecutive menstrual periods. It is important to talk with your health care provider about some of the most common conditions that affect postmenopausal women, such as heart disease, cancer, and bone loss (osteoporosis). Adopting a healthy lifestyle and getting preventive care can help to promote your health and wellness. Those actions can also lower your chances of developing some of these common conditions. What should I know about menopause? During menopause, you may experience a number of symptoms, such as:  Moderate-to-severe hot flashes.  Night sweats.  Decrease in sex drive.  Mood swings.  Headaches.  Tiredness.  Irritability.  Memory problems.  Insomnia.  Choosing to treat or not to treat menopausal changes is an individual decision that you make with your health care provider. What should I know about hormone replacement therapy and supplements? Hormone therapy products are effective for treating symptoms that are associated with menopause, such as hot flashes and night sweats. Hormone replacement carries certain risks, especially as you become older. If you are thinking about using estrogen or estrogen with progestin treatments, discuss the benefits and risks with your health care provider. What should I know about heart disease and stroke? Heart disease, heart attack, and stroke become more likely as you age. This may be due, in part, to the hormonal changes that your body experiences during menopause. These can affect how your body processes dietary fats, triglycerides, and cholesterol. Heart attack and stroke are both medical emergencies. There are many things that you can do to help prevent heart disease  and stroke:  Have your blood pressure checked at least every 1-2 years. High blood pressure causes heart disease and increases the risk of stroke.  If you are 53-22 years old, ask your health care provider if you should take aspirin to prevent a heart attack or a stroke.  Do not use any tobacco products, including cigarettes, chewing tobacco, or electronic cigarettes. If you need help quitting, ask your health care provider.  It is important to eat a healthy diet and maintain a healthy weight. ? Be sure to include plenty of vegetables, fruits, low-fat dairy products, and lean protein. ? Avoid eating foods that are high in solid fats, added sugars, or salt (sodium).  Get regular exercise. This is one of the most important things that you can do for your health. ? Try to exercise for at least 150 minutes each week. The type of exercise that you do should increase your heart rate and make you sweat. This is known as moderate-intensity exercise. ? Try to do strengthening exercises at least twice each week. Do these in addition to the moderate-intensity exercise.  Know your numbers.Ask your health care provider to check your cholesterol and your blood glucose. Continue to have your blood tested as directed by your health care provider.  What should I know about cancer screening? There are several types of cancer. Take the following steps to reduce your risk and to catch any cancer development as early as possible. Breast Cancer  Practice breast self-awareness. ? This means understanding how your breasts normally appear and feel. ? It also means doing regular breast self-exams. Let your health care provider know about any changes, no matter how small.  If you are 40  or older, have a clinician do a breast exam (clinical breast exam or CBE) every year. Depending on your age, family history, and medical history, it may be recommended that you also have a yearly breast X-ray (mammogram).  If you  have a family history of breast cancer, talk with your health care provider about genetic screening.  If you are at high risk for breast cancer, talk with your health care provider about having an MRI and a mammogram every year.  Breast cancer (BRCA) gene test is recommended for women who have family members with BRCA-related cancers. Results of the assessment will determine the need for genetic counseling and BRCA1 and for BRCA2 testing. BRCA-related cancers include these types: ? Breast. This occurs in males or females. ? Ovarian. ? Tubal. This may also be called fallopian tube cancer. ? Cancer of the abdominal or pelvic lining (peritoneal cancer). ? Prostate. ? Pancreatic.  Cervical, Uterine, and Ovarian Cancer Your health care provider may recommend that you be screened regularly for cancer of the pelvic organs. These include your ovaries, uterus, and vagina. This screening involves a pelvic exam, which includes checking for microscopic changes to the surface of your cervix (Pap test).  For women ages 21-65, health care providers may recommend a pelvic exam and a Pap test every three years. For women ages 79-65, they may recommend the Pap test and pelvic exam, combined with testing for human papilloma virus (HPV), every five years. Some types of HPV increase your risk of cervical cancer. Testing for HPV may also be done on women of any age who have unclear Pap test results.  Other health care providers may not recommend any screening for nonpregnant women who are considered low risk for pelvic cancer and have no symptoms. Ask your health care provider if a screening pelvic exam is right for you.  If you have had past treatment for cervical cancer or a condition that could lead to cancer, you need Pap tests and screening for cancer for at least 20 years after your treatment. If Pap tests have been discontinued for you, your risk factors (such as having a new sexual partner) need to be  reassessed to determine if you should start having screenings again. Some women have medical problems that increase the chance of getting cervical cancer. In these cases, your health care provider may recommend that you have screening and Pap tests more often.  If you have a family history of uterine cancer or ovarian cancer, talk with your health care provider about genetic screening.  If you have vaginal bleeding after reaching menopause, tell your health care provider.  There are currently no reliable tests available to screen for ovarian cancer.  Lung Cancer Lung cancer screening is recommended for adults 69-62 years old who are at high risk for lung cancer because of a history of smoking. A yearly low-dose CT scan of the lungs is recommended if you:  Currently smoke.  Have a history of at least 30 pack-years of smoking and you currently smoke or have quit within the past 15 years. A pack-year is smoking an average of one pack of cigarettes per day for one year.  Yearly screening should:  Continue until it has been 15 years since you quit.  Stop if you develop a health problem that would prevent you from having lung cancer treatment.  Colorectal Cancer  This type of cancer can be detected and can often be prevented.  Routine colorectal cancer screening usually begins at  age 42 and continues through age 45.  If you have risk factors for colon cancer, your health care provider may recommend that you be screened at an earlier age.  If you have a family history of colorectal cancer, talk with your health care provider about genetic screening.  Your health care provider may also recommend using home test kits to check for hidden blood in your stool.  A small camera at the end of a tube can be used to examine your colon directly (sigmoidoscopy or colonoscopy). This is done to check for the earliest forms of colorectal cancer.  Direct examination of the colon should be repeated every  5-10 years until age 71. However, if early forms of precancerous polyps or small growths are found or if you have a family history or genetic risk for colorectal cancer, you may need to be screened more often.  Skin Cancer  Check your skin from head to toe regularly.  Monitor any moles. Be sure to tell your health care provider: ? About any new moles or changes in moles, especially if there is a change in a mole's shape or color. ? If you have a mole that is larger than the size of a pencil eraser.  If any of your family members has a history of skin cancer, especially at a Lotoya Casella age, talk with your health care provider about genetic screening.  Always use sunscreen. Apply sunscreen liberally and repeatedly throughout the day.  Whenever you are outside, protect yourself by wearing long sleeves, pants, a wide-brimmed hat, and sunglasses.  What should I know about osteoporosis? Osteoporosis is a condition in which bone destruction happens more quickly than new bone creation. After menopause, you may be at an increased risk for osteoporosis. To help prevent osteoporosis or the bone fractures that can happen because of osteoporosis, the following is recommended:  If you are 46-71 years old, get at least 1,000 mg of calcium and at least 600 mg of vitamin D per day.  If you are older than age 55 but younger than age 65, get at least 1,200 mg of calcium and at least 600 mg of vitamin D per day.  If you are older than age 54, get at least 1,200 mg of calcium and at least 800 mg of vitamin D per day.  Smoking and excessive alcohol intake increase the risk of osteoporosis. Eat foods that are rich in calcium and vitamin D, and do weight-bearing exercises several times each week as directed by your health care provider. What should I know about how menopause affects my mental health? Depression may occur at any age, but it is more common as you become older. Common symptoms of depression  include:  Low or sad mood.  Changes in sleep patterns.  Changes in appetite or eating patterns.  Feeling an overall lack of motivation or enjoyment of activities that you previously enjoyed.  Frequent crying spells.  Talk with your health care provider if you think that you are experiencing depression. What should I know about immunizations? It is important that you get and maintain your immunizations. These include:  Tetanus, diphtheria, and pertussis (Tdap) booster vaccine.  Influenza every year before the flu season begins.  Pneumonia vaccine.  Shingles vaccine.  Your health care provider may also recommend other immunizations. This information is not intended to replace advice given to you by your health care provider. Make sure you discuss any questions you have with your health care provider. Document Released: 06/15/2005  Document Revised: 11/11/2015 Document Reviewed: 01/25/2015 Elsevier Interactive Patient Education  2018 [employer]  

## 2018-03-05 NOTE — Progress Notes (Signed)
SOLENE HEREFORD 1965/08/24 [id number]    History:    Presents for annual exam.  2010 LAVH with BSO for fibroids and menorrhagia on no HRT.  Hot flushes - tolerable.  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  2016 T score -1.3 at spine, hip +1.4 FRAX 2.8% / 0%.  New partner.  2010- colonoscopy.  Hypertension-primary care manages labs and meds  Past medical history, past surgical history, family history and social history were all reviewed and documented in the EPIC chart.  Pre-k Runner, broadcasting/film/video.  3 daughters in their 74s, one is an attorney, one is account for [employer], one is a Investment banker, operational.  Has 2 adopted daughters one is doing well, one left home at age 9-1/2 after 12 years of living with her, autistic back with biologic mother and now pregnant.  ROS:  A ROS was performed and pertinent positives and negatives are included.  Exam:  Vitals:   03/05/18 1611  BP: 126/80  Weight: 213 lb (96.6 kg)  Height: 5\' 5"  (1.651 m)   Body mass index is 35.45 kg/m.   General appearance:  Normal Thyroid:  Symmetrical, normal in size, without palpable masses or nodularity. Respiratory  Auscultation:  Clear without wheezing or rhonchi Cardiovascular  Auscultation:  Regular rate, without rubs, murmurs or gallops  Edema/varicosities:  Not grossly evident Abdominal  Soft,nontender, without masses, guarding or rebound.  Liver/spleen:  No organomegaly noted  Hernia:  None appreciated  Skin  Inspection:  Grossly normal   Breasts: Examined lying and sitting.     Right: Without masses, retractions, discharge or axillary adenopathy.     Left: Without masses, retractions, discharge or axillary adenopathy. Gentitourinary   Inguinal/mons:  Normal without inguinal adenopathy  External genitalia:  Normal  BUS/Urethra/Skene's glands:  Normal  Vagina:  Normal  Cervix: And uterus absent adnexa/parametria:     Rt: Without masses or tenderness.   Lt: Without masses or tenderness.  Anus and  perineum: Normal  Digital rectal exam: Normal sphincter tone without palpated masses or tenderness  Assessment/Plan:  52 y.o. SB F G3 P3 +1 adopted daughter for annual exam with no complaints.  2010 LAVH with BSO for fibroids and menorrhagia Osteopenia without elevated FRAX Hypertension, arthritis-primary care manages labs and meds Right knee pain has follow-up scheduled with orthopedist. Obesity  Plan: SBE's, continue annual screening mammogram, calcium rich foods, vitamin D 2000 daily encouraged.  Encouraged increase exercise and decrease calories and carbs.  Home safety, fall prevention and importance of weightbearing and balance type exercise reviewed and encouraged.  Repeat DEXA, instructed to schedule.  Repeat colonoscopy due next year.  GC/chlamydia from urethra, denies need for HIV, hepatitis or RPR.   Harrington Challenger [facility], 4:44 PM 03/05/2018

## 2018-03-06 NOTE — Addendum Note (Signed)
Addended by: Tito Dine on: 03/06/2018 08:23 AM   Modules accepted: Orders

## 2018-03-07 LAB — C. TRACHOMATIS/N. GONORRHOEAE RNA
C. trachomatis RNA, TMA: NOT DETECTED
N. gonorrhoeae RNA, TMA: NOT DETECTED

## 2018-05-06 ENCOUNTER — Ambulatory Visit (INDEPENDENT_AMBULATORY_CARE_PROVIDER_SITE_OTHER): Payer: BC Managed Care – PPO

## 2018-05-06 ENCOUNTER — Other Ambulatory Visit: Payer: Self-pay | Admitting: Gynecology

## 2018-05-06 DIAGNOSIS — M8588 Other specified disorders of bone density and structure, other site: Secondary | ICD-10-CM | POA: Diagnosis not present

## 2018-05-06 DIAGNOSIS — Z13820 Encounter for screening for osteoporosis: Secondary | ICD-10-CM

## 2018-05-08 ENCOUNTER — Encounter: Payer: Self-pay | Admitting: Gynecology

## 2018-11-27 ENCOUNTER — Encounter: Payer: Self-pay | Admitting: Women's Health

## 2019-01-28 ENCOUNTER — Encounter: Payer: Self-pay | Admitting: Gynecology

## 2019-03-10 ENCOUNTER — Other Ambulatory Visit: Payer: Self-pay

## 2019-03-10 ENCOUNTER — Encounter: Payer: Self-pay | Admitting: Women's Health

## 2019-03-10 ENCOUNTER — Ambulatory Visit: Payer: BC Managed Care – PPO | Admitting: Women's Health

## 2019-03-10 VITALS — BP 140/80 | Ht 65.0 in | Wt 215.0 lb

## 2019-03-10 DIAGNOSIS — Z01419 Encounter for gynecological examination (general) (routine) without abnormal findings: Secondary | ICD-10-CM

## 2019-03-10 NOTE — Patient Instructions (Addendum)
Good to see you today! Vit D3 2000 iu daily  Health Maintenance for Postmenopausal Women Menopause is a normal process in which your ability to get pregnant comes to an end. This process happens slowly over many months or years, usually between the ages of 48 and 55. Menopause is complete when you have missed your menstrual periods for 12 months. It is important to talk with your health care provider about some of the most common conditions that affect women after menopause (postmenopausal women). These include heart disease, cancer, and bone loss (osteoporosis). Adopting a healthy lifestyle and getting preventive care can help to promote your health and wellness. The actions you take can also lower your chances of developing some of these common conditions. What should I know about menopause? During menopause, you may get a number of symptoms, such as:  Hot flashes. These can be moderate or severe.  Night sweats.  Decrease in sex drive.  Mood swings.  Headaches.  Tiredness.  Irritability.  Memory problems.  Insomnia. Choosing to treat or not to treat these symptoms is a decision that you make with your health care provider. Do I need hormone replacement therapy?  Hormone replacement therapy is effective in treating symptoms that are caused by menopause, such as hot flashes and night sweats.  Hormone replacement carries certain risks, especially as you become older. If you are thinking about using estrogen or estrogen with progestin, discuss the benefits and risks with your health care provider. What is my risk for heart disease and stroke? The risk of heart disease, heart attack, and stroke increases as you age. One of the causes may be a change in the body's hormones during menopause. This can affect how your body uses dietary fats, triglycerides, and cholesterol. Heart attack and stroke are medical emergencies. There are many things that you can do to help prevent heart disease  and stroke. Watch your blood pressure  High blood pressure causes heart disease and increases the risk of stroke. This is more likely to develop in people who have high blood pressure readings, are of African descent, or are overweight.  Have your blood pressure checked: ? Every 3-5 years if you are 18-39 years of age. ? Every year if you are 40 years old or older. Eat a healthy diet   Eat a diet that includes plenty of vegetables, fruits, low-fat dairy products, and lean protein.  Do not eat a lot of foods that are high in solid fats, added sugars, or sodium. Get regular exercise Get regular exercise. This is one of the most important things you can do for your health. Most adults should:  Try to exercise for at least 150 minutes each week. The exercise should increase your heart rate and make you sweat (moderate-intensity exercise).  Try to do strengthening exercises at least twice each week. Do these in addition to the moderate-intensity exercise.  Spend less time sitting. Even light physical activity can be beneficial. Other tips  Work with your health care provider to achieve or maintain a healthy weight.  Do not use any products that contain nicotine or tobacco, such as cigarettes, e-cigarettes, and chewing tobacco. If you need help quitting, ask your health care provider.  Know your numbers. Ask your health care provider to check your cholesterol and your blood sugar (glucose). Continue to have your blood tested as directed by your health care provider. Do I need screening for cancer? Depending on your health history and family history, you may need   to have cancer screening at different stages of your life. This may include screening for:  Breast cancer.  Cervical cancer.  Lung cancer.  Colorectal cancer. What is my risk for osteoporosis? After menopause, you may be at increased risk for osteoporosis. Osteoporosis is a condition in which bone destruction happens more  quickly than new bone creation. To help prevent osteoporosis or the bone fractures that can happen because of osteoporosis, you may take the following actions:  If you are 19-50 years old, get at least 1,000 mg of calcium and at least 600 mg of vitamin D per day.  If you are older than age 50 but younger than age 70, get at least 1,200 mg of calcium and at least 600 mg of vitamin D per day.  If you are older than age 70, get at least 1,200 mg of calcium and at least 800 mg of vitamin D per day. Smoking and drinking excessive alcohol increase the risk of osteoporosis. Eat foods that are rich in calcium and vitamin D, and do weight-bearing exercises several times each week as directed by your health care provider. How does menopause affect my mental health? Depression may occur at any age, but it is more common as you become older. Common symptoms of depression include:  Low or sad mood.  Changes in sleep patterns.  Changes in appetite or eating patterns.  Feeling an overall lack of motivation or enjoyment of activities that you previously enjoyed.  Frequent crying spells. Talk with your health care provider if you think that you are experiencing depression. General instructions See your health care provider for regular wellness exams and vaccines. This may include:  Scheduling regular health, dental, and eye exams.  Getting and maintaining your vaccines. These include: ? Influenza vaccine. Get this vaccine each year before the flu season begins. ? Pneumonia vaccine. ? Shingles vaccine. ? Tetanus, diphtheria, and pertussis (Tdap) booster vaccine. Your health care provider may also recommend other immunizations. Tell your health care provider if you have ever been abused or do not feel safe at home. Summary  Menopause is a normal process in which your ability to get pregnant comes to an end.  This condition causes hot flashes, night sweats, decreased interest in sex, mood swings,  headaches, or lack of sleep.  Treatment for this condition may include hormone replacement therapy.  Take actions to keep yourself healthy, including exercising regularly, eating a healthy diet, watching your weight, and checking your blood pressure and blood sugar levels.  Get screened for cancer and depression. Make sure that you are up to date with all your vaccines. This information is not intended to replace advice given to you by your health care provider. Make sure you discuss any questions you have with your health care provider. Document Released: 06/15/2005 Document Revised: 04/16/2018 Document Reviewed: 04/16/2018 Elsevier Patient Education  2020 Elsevier Inc.  

## 2019-03-10 NOTE — Progress Notes (Signed)
Breanna Castro 1966-02-27 [id number]    History:    Presents for annual exam.  2010 LAVH with BSO on no HRT for fibroids and menorrhagia.  Normal Pap and mammogram history.  2019 DEXA stable T score -1.3 at spine hips +1.4.  2020 2 benign colon polyps 5-year follow-up, colonoscopy in [place].  Has had Shingrix.  Primary care manages hypertension, asthma.  Same partner.  Past medical history, past surgical history, family history and social history were all reviewed and documented in the EPIC chart.  Teacher and a school bus driver.  Originally from [place].  3 daughters, 1 is an Forensic psychologist, one is a Biomedical scientist, one works for [employer].  Also has an adopted daughter Breanna Castro who is 15 doing well mild autism.  Also close to divorced husband's 2 children.  ROS:  A ROS was performed and pertinent positives and negatives are included.  Exam:  Vitals:   03/10/19 1544  BP: 140/80  Weight: 215 lb (97.5 kg)  Height: 5\' 5"  (1.651 m)   Body mass index is 35.78 kg/m.   General appearance:  Normal Thyroid:  Symmetrical, normal in size, without palpable masses or nodularity. Respiratory  Auscultation:  Clear without wheezing or rhonchi Cardiovascular  Auscultation:  Regular rate, without rubs, murmurs or gallops  Edema/varicosities:  Not grossly evident Abdominal  Soft,nontender, without masses, guarding or rebound.  Liver/spleen:  No organomegaly noted  Hernia:  None appreciated  Skin  Inspection:  Grossly normal   Breasts: Examined lying and sitting.     Right: Without masses, retractions, discharge or axillary adenopathy.     Left: Without masses, retractions, discharge or axillary adenopathy. Gentitourinary   Inguinal/mons:  Normal without inguinal adenopathy  External genitalia:  Normal  BUS/Urethra/Skene's glands:  Normal  Vagina:  Normal  Cervix: And uterus absent  Adnexa/parametria:     Rt: Without masses or tenderness.   Lt: Without masses or tenderness.  Anus and  perineum: Normal  Digital rectal exam: Normal sphincter tone without palpated masses or tenderness  Assessment/Plan:  53 y.o. DBF G3, P3 +1 adopted for annual exam with no complaints.  2010 LAVH with BSO for menorrhagia and fibroids no HRT Mild osteopenia without elevated FRAX stable 2020 benign colon polyps 5-year follow-up Hypertension, asthma-primary care manages labs and meds Knee pain orthopedist managing.  Plan: SBEs, continue annual screening mammogram, calcium rich foods, vitamin D 2000 daily encouraged.  Importance of weightbearing and balance type exercise , home safety, fall prevention discussed.  Pap screening guidelines reviewed.    [place], 4:11 PM 03/10/2019

## 2020-03-16 ENCOUNTER — Encounter: Payer: BC Managed Care – PPO | Admitting: Nurse Practitioner

## 2020-04-26 ENCOUNTER — Other Ambulatory Visit: Payer: Self-pay

## 2020-04-26 ENCOUNTER — Ambulatory Visit: Payer: BC Managed Care – PPO | Admitting: Nurse Practitioner

## 2020-04-26 ENCOUNTER — Encounter: Payer: Self-pay | Admitting: Nurse Practitioner

## 2020-04-26 VITALS — BP 125/80 | Ht 65.0 in | Wt 219.2 lb

## 2020-04-26 DIAGNOSIS — Z13820 Encounter for screening for osteoporosis: Secondary | ICD-10-CM

## 2020-04-26 DIAGNOSIS — Z01419 Encounter for gynecological examination (general) (routine) without abnormal findings: Secondary | ICD-10-CM

## 2020-04-26 DIAGNOSIS — M8588 Other specified disorders of bone density and structure, other site: Secondary | ICD-10-CM | POA: Diagnosis not present

## 2020-04-26 DIAGNOSIS — Z90710 Acquired absence of both cervix and uterus: Secondary | ICD-10-CM | POA: Diagnosis not present

## 2020-04-26 NOTE — Progress Notes (Signed)
   Breanna Castro 06-29-1965 [id number]   History:  54 y.o. [id number]+5 presents for annual exam. 2010 LAVH BSO for fibroids and menorrhagia, on no HRT. Normal pap and mammogram history. Osteopenia.   Gynecologic History No LMP recorded. Patient has had a hysterectomy.   Contraception: status post hysterectomy Last Pap: No longer screening per guidelines Last mammogram: 11/27/2018. Results were: normal Last colonoscopy: 2020. Results were: polyps, 5-year recall Last Dexa: 04/2018. Results were: t-score -1.5, FRAX 2.5% / 0.2%, 2-year recall  Past medical history, past surgical history, family history and social history were all reviewed and documented in the EPIC chart.  ROS:  A ROS was performed and pertinent positives and negatives are included.  Exam:  Vitals:   04/26/20 1604  BP: 125/80  Weight: 219 lb 3.2 oz (99.4 kg)  Height: 5\' 5"  (1.651 m)   Body mass index is 36.48 kg/m.  General appearance:  Normal Thyroid:  Symmetrical, normal in size, without palpable masses or nodularity. Respiratory  Auscultation:  Clear without wheezing or rhonchi Cardiovascular  Auscultation:  Regular rate, without rubs, murmurs or gallops  Edema/varicosities:  Not grossly evident Abdominal  Soft,nontender, without masses, guarding or rebound.  Liver/spleen:  No organomegaly noted  Hernia:  None appreciated  Skin  Inspection:  Grossly normal   Breasts: Examined lying and sitting.   Right: Without masses, retractions, discharge or axillary adenopathy.   Left: Without masses, retractions, discharge or axillary adenopathy. Gentitourinary   Inguinal/mons:  Normal without inguinal adenopathy  External genitalia:  Normal  BUS/Urethra/Skene's glands:  Normal  Vagina:  Atrophic changes  Cervix:  Absent  Uterus:  Absent  Adnexa/parametria:     Rt: Without masses or tenderness.   Lt: Without masses or tenderness.  Anus and perineum: Normal  Digital rectal exam: Normal sphincter tone without  palpated masses or tenderness  Assessment/Plan:  54 y.o. 57 for annual exam.   Well female exam with routine gynecological exam - Education provided on SBEs, importance of preventative screenings, current guidelines, high calcium diet, regular exercise, and multivitamin daily. Labs with PCP.   Osteopenia of spine- 04/2018 t-score -1.5.   Screening for osteoporosis- She will schedule bone density soon  History of laparoscopic-assisted vaginal hysterectomy - 2010 with BSO for fibroids and menorrhagia. No HRT.   Screening for cervical cancer - Normal Pap history.  No longer screening per guidelines  Screening for breast cancer - Normal mammogram history.  Continue annual screenings.  Normal breast exam today.  Screening for colon cancer - 2020 colonoscopy. Will repeat at GI's recommended interval.   Follow up in 1 year for annual.       2021 [facility], 4:13 PM 04/26/2020

## 2020-04-26 NOTE — Patient Instructions (Signed)

## 2020-06-07 ENCOUNTER — Encounter: Payer: Self-pay | Admitting: Nurse Practitioner

## 2020-06-07 ENCOUNTER — Other Ambulatory Visit: Payer: Self-pay

## 2020-06-07 ENCOUNTER — Other Ambulatory Visit: Payer: Self-pay | Admitting: Nurse Practitioner

## 2020-06-07 ENCOUNTER — Ambulatory Visit (INDEPENDENT_AMBULATORY_CARE_PROVIDER_SITE_OTHER): Payer: BC Managed Care – PPO

## 2020-06-07 DIAGNOSIS — Z13820 Encounter for screening for osteoporosis: Secondary | ICD-10-CM

## 2020-06-07 DIAGNOSIS — M8588 Other specified disorders of bone density and structure, other site: Secondary | ICD-10-CM | POA: Diagnosis not present

## 2020-06-07 DIAGNOSIS — Z780 Asymptomatic menopausal state: Secondary | ICD-10-CM | POA: Diagnosis not present

## 2021-04-27 ENCOUNTER — Encounter: Payer: Self-pay | Admitting: Nurse Practitioner

## 2021-04-27 ENCOUNTER — Other Ambulatory Visit: Payer: Self-pay

## 2021-04-27 ENCOUNTER — Ambulatory Visit (INDEPENDENT_AMBULATORY_CARE_PROVIDER_SITE_OTHER): Payer: BC Managed Care – PPO | Admitting: Nurse Practitioner

## 2021-04-27 VITALS — BP 122/82 | Ht 65.5 in | Wt 214.0 lb

## 2021-04-27 DIAGNOSIS — Z01419 Encounter for gynecological examination (general) (routine) without abnormal findings: Secondary | ICD-10-CM

## 2021-04-27 DIAGNOSIS — Z780 Asymptomatic menopausal state: Secondary | ICD-10-CM | POA: Diagnosis not present

## 2021-04-27 DIAGNOSIS — M8588 Other specified disorders of bone density and structure, other site: Secondary | ICD-10-CM

## 2021-04-27 NOTE — Progress Notes (Signed)
° °  MARLA POULIOT 1965/08/23 [id number]   History:  55 y.o. [id number]+0 presents for annual exam. Postmenopausal - no HRT. S/P 2010 LAVH BSO for fibroids and menorrhagia. Normal pap and mammogram history.   Gynecologic History No LMP recorded. Patient has had a hysterectomy.   Contraception: status post hysterectomy Sexually active: No         Health maintenance Last Pap: 2012. Results were: Normal Last mammogram: 11/2020. Results were: Normal after follow up U/S Last colonoscopy: 2020. Results were: benign polyps, 5-year recall Last Dexa: 06/07/2020. Results were: T-score -1.6 at spine, all other sites normal. FRAX 2.1% / 0.0%  Past medical history, past surgical history, family history and social history were all reviewed and documented in the EPIC chart. 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 1 yo granddaughter, twins due February 2023.   ROS:  A ROS was performed and pertinent positives and negatives are included.  Exam:  Vitals:   04/27/21 1554  BP: 122/82  Weight: 214 lb (97.1 kg)  Height: 5' 5.5" (1.664 m)    Body mass index is 35.07 kg/m.  General appearance:  Normal Thyroid:  Symmetrical, normal in size, without palpable masses or nodularity. Respiratory  Auscultation:  Clear without wheezing or rhonchi Cardiovascular  Auscultation:  Regular rate, without rubs, murmurs or gallops  Edema/varicosities:  Not grossly evident Abdominal  Soft,nontender, without masses, guarding or rebound.  Liver/spleen:  No organomegaly noted  Hernia:  None appreciated  Skin  Inspection:  Grossly normal   Breasts: Examined lying and sitting.   Right: Without masses, retractions, discharge or axillary adenopathy.   Left: Without masses, retractions, discharge or axillary adenopathy. Genitourinary   Inguinal/mons:  Normal without inguinal adenopathy  External genitalia:  Normal appearing vulva with no masses, tenderness, or lesions  BUS/Urethra/Skene's glands:  Normal  Vagina:   Normal appearing with normal color and discharge, no lesions  Cervix:  Absent  Uterus:  Absent  Adnexa/parametria:     Rt: Normal in size, without masses or tenderness.   Lt: Normal in size, without masses or tenderness.  Anus and perineum: Normal  Digital rectal exam: Normal sphincter tone without palpated masses or tenderness  Patient informed chaperone available to be present for breast and pelvic exam. Patient has requested no chaperone to be present. Patient has been advised what will be completed during breast and pelvic exam.   Assessment/Plan:  55 y.o. [id number]+7 for annual exam.   Well female exam with routine gynecological exam - Education provided on SBEs, importance of preventative screenings, current guidelines, high calcium diet, regular exercise, and multivitamin daily.  Labs with PCP.   Postmenopausal - no HRT. Mild hot flashes. S/P 2010 LAVH BSO for fibroids and menorrhagia.  Osteopenia of spine - T-score-1.6, all other sites normal. Will repeat at 2-year interval per recommendation.   Screening for cervical cancer - Normal Pap history. No longer screening per guidelines  Screening for breast cancer - Normal mammogram history. Continue annual screenings.  Normal breast exam today.  Screening for colon cancer - 2020 colonoscopy. Will repeat at GI's recommended interval.   Follow up in 1 year for annual.       Olivia Mackie [facility], 4:14 PM 04/27/2021

## 2022-05-01 ENCOUNTER — Ambulatory Visit: Payer: BC Managed Care – PPO | Admitting: Nurse Practitioner

## 2022-05-02 ENCOUNTER — Ambulatory Visit: Payer: BC Managed Care – PPO | Admitting: Nurse Practitioner

## 2022-05-02 ENCOUNTER — Encounter: Payer: Self-pay | Admitting: Radiology

## 2022-05-02 ENCOUNTER — Ambulatory Visit (INDEPENDENT_AMBULATORY_CARE_PROVIDER_SITE_OTHER): Payer: BC Managed Care – PPO | Admitting: Radiology

## 2022-05-02 VITALS — BP 122/86 | Ht 64.5 in | Wt 212.0 lb

## 2022-05-02 DIAGNOSIS — Z01419 Encounter for gynecological examination (general) (routine) without abnormal findings: Secondary | ICD-10-CM

## 2022-05-02 NOTE — Progress Notes (Signed)
   Breanna Castro 1965/05/26 [id number]   History:  56 y.o. G3P3 presents for annual exam s/p hyst BSO 2010. No gyn concerns.  Gynecologic History Hysterectomy: fibroids  Sexually active: no x 1 year  Health Maintenance Last Pap: 2012. Results were:normal, no abnormal pap hx, s/p hyst  Last mammogram: 12/2021. Results were: normal Last colonoscopy: 2020.  Last Dexa: 2022.   Past medical history, past surgical history, family history and social history were all reviewed and documented in the EPIC chart.  ROS:  A ROS was performed and pertinent positives and negatives are included.  Exam:  Vitals:   05/02/22 1024  BP: 122/86  Weight: 212 lb (96.2 kg)  Height: 5' 4.5" (1.638 m)   Body mass index is 35.83 kg/m.  General appearance:  Normal Thyroid:  Symmetrical, normal in size, without palpable masses or nodularity. Respiratory  Auscultation:  Clear without wheezing or rhonchi Cardiovascular  Auscultation:  Regular rate, without rubs, murmurs or gallops  Edema/varicosities:  Not grossly evident Abdominal  Soft,nontender, without masses, guarding or rebound.  Liver/spleen:  No organomegaly noted  Hernia:  None appreciated  Skin  Inspection:  Grossly normal Breasts: Examined lying and sitting.   Right: Without masses, retractions, nipple discharge or axillary adenopathy.   Left: Without masses, retractions, nipple discharge or axillary adenopathy. Genitourinary   Inguinal/mons:  Normal without inguinal adenopathy  External genitalia:  Normal appearing vulva with no masses, tenderness, or lesions  BUS/Urethra/Skene's glands:  Normal  Vagina:  Normal appearing with normal color and discharge, no lesions. Atrophy mild  Cervix:  absent  Uterus:  absent  Adnexa/parametria:  surgically absent bilaterally  Anus and perineum: Normal  Patient informed chaperone available to be present for breast and pelvic exam. Patient has requested no chaperone to be present. Patient has been  advised what will be completed during breast and pelvic exam.   Assessment/Plan:   -Well woman exam   Discussed SBE, colonoscopy and DEXA (2024) screening as appropriate. Encouraged 169mins/week of cardiovascular and weight bearing exercise minimum. Recommend the use of seatbelts and sunscreen consistently.   Return in 1 year for annual or sooner prn.  Arlie Solomons B WHNP-BC 11:15 AM 05/02/2022

## 2023-05-23 ENCOUNTER — Ambulatory Visit: Payer: BC Managed Care – PPO | Admitting: Radiology

## 2023-05-27 ENCOUNTER — Ambulatory Visit (INDEPENDENT_AMBULATORY_CARE_PROVIDER_SITE_OTHER): Payer: 59 | Admitting: Obstetrics and Gynecology

## 2023-05-27 ENCOUNTER — Encounter: Payer: Self-pay | Admitting: Obstetrics and Gynecology

## 2023-05-27 VITALS — BP 132/70 | HR 82 | Ht 64.25 in | Wt 212.0 lb

## 2023-05-27 DIAGNOSIS — M8580 Other specified disorders of bone density and structure, unspecified site: Secondary | ICD-10-CM | POA: Diagnosis not present

## 2023-05-27 DIAGNOSIS — Z01419 Encounter for gynecological examination (general) (routine) without abnormal findings: Secondary | ICD-10-CM | POA: Diagnosis not present

## 2023-05-27 DIAGNOSIS — N958 Other specified menopausal and perimenopausal disorders: Secondary | ICD-10-CM

## 2023-05-27 NOTE — Assessment & Plan Note (Signed)
 Continue vitamin D+Calcium Encouraged weight based exercise DXA due

## 2023-05-27 NOTE — Assessment & Plan Note (Signed)
Cervical cancer screening performed according to ASCCP guidelines. Encouraged annual mammogram screening Colonoscopy UTD DXA due Labs and immunizations with her primary Encouraged safe sexual practices as indicated Encouraged healthy lifestyle practices with diet and exercise For patients under 50-60yo, I recommend 1200mg  calcium daily and 600IU of vitamin D daily.

## 2023-05-27 NOTE — Progress Notes (Signed)
58 y.o. [id number] postmenopausal female s/p TVH, BSO (fibroids) with osteopenia, [facility] here for annual exam. Single. Pre K teacher.  No complaints. Tried HRT years ago and did not like it. Both knees are not doing well, she is holding off on a left knee replacement at this time. Notes a sensitive vulva for which she uses Dove sensitive skin.  Postmenopausal bleeding: none Pelvic discharge or pain: none Breast mass, nipple discharge or skin changes : none Last PAP: No results found for: "DIAGPAP", "HPVHIGH", "ADEQPAP" Last mammogram: 2024, [facility] Last colonoscopy: 02/19/2019, every 5 years Last DXA: 05/08/2020, osteopenia Sexually active: No Exercising: No, due to both knees Smoker: No  GYN HISTORY: Prior hysterectomy  OB History  Gravida Para Term Preterm AB Living  3 3 3   3   SAB IAB Ectopic Multiple Live Births      2    # Outcome Date GA Lbr Len/2nd Weight Sex Type Anes PTL Lv  3 Term     F Vag-Spont  N LIV  2 Term     F Vag-Spont  N   1 Term     F Vag-Spont  N LIV    Past Medical History:  Diagnosis Date   Arthritis    Broken foot    DDD (degenerative disc disease), lumbosacral    Hypertension    Osteopenia 04/2018   T score -1.5 FRAX 2.6% / 0.2% stable from prior DEXA    Past Surgical History:  Procedure Laterality Date   BREAST LUMPECTOMY     left breast fibroadenoma   OOPHORECTOMY     BSO   VAGINAL HYSTERECTOMY     BSO    Current Outpatient Medications on File Prior to Visit  Medication Sig Dispense Refill   aspirin 81 MG chewable tablet Chew 81 mg by mouth daily.     beclomethasone (QVAR REDIHALER) 80 MCG/ACT inhaler INHALE 1 PUFF INTO THE LUNGS TWICE DAILY     Cetirizine HCl (ZYRTEC PO) Take by mouth.     hydrochlorothiazide 25 MG tablet Take 25 mg by mouth daily.     ibuprofen (ADVIL,MOTRIN) 800 MG tablet Take 800 mg by mouth every 8 (eight) hours as needed.     lisinopril (ZESTRIL) 5 MG tablet Take by mouth.     meloxicam (MOBIC) 15 MG tablet Take  15 mg by mouth daily as needed.     Multiple Vitamin (MULTIVITAMIN) tablet Take 1 tablet by mouth daily.     olopatadine (PATANOL) 0.1 % ophthalmic solution Apply to eye as needed.     Potassium (POTASSIMIN PO) Take by mouth.     Probiotic Product (PROBIOTIC PO) Take by mouth daily.     No current facility-administered medications on file prior to visit.    Social History   Socioeconomic History   Marital status: Single    Spouse name: Not on file   Number of children: Not on file   Years of education: Not on file   Highest education level: Not on file  Occupational History   Not on file  Tobacco Use   Smoking status: Never    Passive exposure: Never   Smokeless tobacco: Never  Vaping Use   Vaping status: Never Used  Substance and Sexual Activity   Alcohol use: Yes    Alcohol/week: 1.0 standard drink of alcohol    Types: 1 Glasses of wine per week    Comment: SOCIALLY   Drug use: No   Sexual activity: Not Currently  Partners: Male    Birth control/protection: Surgical    Comment: menarche 58yo, 1st intercourse 58 yo- More than 5 partners  Other Topics Concern   Not on file  Social History Narrative   Not on file   Social Drivers of Health   Financial Resource Strain: Low Risk  (08/14/2022)   Received from [facility]   Overall Financial Resource Strain (CARDIA)    Difficulty of Paying Living Expenses: Not hard at all  Food Insecurity: Low Risk  (01/18/2023)   Received from [facility]   Hunger Vital Sign    Worried About Running Out of Food in the Last Year: Never true    Ran Out of Food in the Last Year: Never true  Transportation Needs: No Transportation Needs (01/18/2023)   Received from [facility]    In the past 12 months, has lack of reliable transportation kept you from medical appointments, meetings, work or from getting things needed for daily living? : No  Physical Activity: Unknown (08/14/2022)   [facility]    Exercise Vital Sign    Days of Exercise per Week: 3 days    Minutes of Exercise per Session: Not on file  Stress: Not on file  Social Connections: Socially Integrated (08/14/2022)   Received from [facility]   Social Network    How would you rate your social network (family, work, friends)?: Good participation with social networks  Intimate Partner Violence: Not At Risk (08/14/2022)   Received from [facility]   HITS    Over the last 12 months how often did your partner physically hurt you?: Never    Over the last 12 months how often did your partner insult you or talk down to you?: Never    Over the last 12 months how often did your partner threaten you with physical harm?: Never    Over the last 12 months how often did your partner scream or curse at you?: Never    Family History  Problem Relation Age of Onset   Hypertension Mother    Osteoporosis Mother    Hypertension Father    Lupus Daughter     No Known Allergies    PE Today's Vitals   05/27/23 1341  BP: 132/70  Pulse: 82  SpO2: 98%  Weight: 212 lb (96.2 kg)  Height: 5' 4.25" (1.632 m)   Body mass index is 36.11 kg/m.  Physical Exam Vitals reviewed. Exam conducted with a chaperone present.  Constitutional:      General: She is not in acute distress.    Appearance: Normal appearance.  HENT:     Head: Normocephalic and atraumatic.     Nose: Nose normal.  Eyes:     Extraocular Movements: Extraocular movements intact.     Conjunctiva/sclera: Conjunctivae normal.  Neck:     Thyroid: No thyroid mass, thyromegaly or thyroid tenderness.  Pulmonary:     Effort: Pulmonary effort is normal.  Chest:     Chest wall: No mass or tenderness.  Breasts:    Right: Normal. No swelling, mass, nipple discharge or tenderness.     Left: Normal. No swelling, mass, nipple discharge or tenderness.  Abdominal:     General: There is no distension.     Palpations: Abdomen is soft.     Tenderness: There is no abdominal  tenderness.  Genitourinary:    General: Normal vulva.     Exam position: Lithotomy position.     Urethra: No  prolapse.     Vagina: Normal. No vaginal discharge or bleeding.     Cervix: No lesion.     Adnexa: Right adnexa normal and left adnexa normal.     Comments: Cervix and uterus absent Musculoskeletal:        General: Normal range of motion.     Cervical back: Normal range of motion.  Lymphadenopathy:     Upper Body:     Right upper body: No axillary adenopathy.     Left upper body: No axillary adenopathy.     Lower Body: No right inguinal adenopathy. No left inguinal adenopathy.  Skin:    General: Skin is warm and dry.  Neurological:     General: No focal deficit present.     Mental Status: She is alert.  Psychiatric:        Mood and Affect: Mood normal.        Behavior: Behavior normal.       Assessment and Plan:        Well woman exam with routine gynecological exam Assessment & Plan: Cervical cancer screening performed according to ASCCP guidelines. Encouraged annual mammogram screening Colonoscopy UTD DXA due Labs and immunizations with her primary Encouraged safe sexual practices as indicated Encouraged healthy lifestyle practices with diet and exercise For patients under 50-70yo, I recommend 1200mg  calcium daily and 600IU of vitamin D daily.    Osteopenia, unspecified location Assessment & Plan: Continue vitamin D+Calcium Encouraged weight based exercise DXA due   Orders: -     DG Bone Density; Future  Genitourinary syndrome of menopause  Considering applying aquaphor or coconut oil to the vulva after showers. You could also using daily vaginal moisturizers by brands like Good Clean Love and Ah! Yes.  Declines vaginal estrogen at this time.  Rosalyn Gess, MD

## 2023-05-27 NOTE — Patient Instructions (Addendum)
For patients under 50-60yo, I recommend 1200mg  calcium daily and 600IU of vitamin D daily. For patients over 60yo, I recommend 1200mg  calcium daily and 800IU of vitamin D daily.  Call [phone] to schedule your bone density scan at [facility].  Considering applying aquaphor or coconut oil to the vulva after showers. You could also using daily vaginal moisturizers by brands like Good Clean Love and Ah! Yes.  Health Maintenance, Female Adopting a healthy lifestyle and getting preventive care are important in promoting health and wellness. Ask your health care provider about: The right schedule for you to have regular tests and exams. Things you can do on your own to prevent diseases and keep yourself healthy. What should I know about diet, weight, and exercise? Eat a healthy diet  Eat a diet that includes plenty of vegetables, fruits, low-fat dairy products, and lean protein. Do not eat a lot of foods that are high in solid fats, added sugars, or sodium. Maintain a healthy weight Body mass index (BMI) is used to identify weight problems. It estimates body fat based on height and weight. Your health care provider can help determine your BMI and help you achieve or maintain a healthy weight. Get regular exercise Get regular exercise. This is one of the most important things you can do for your health. Most adults should: Exercise for at least 150 minutes each week. The exercise should increase your heart rate and make you sweat (moderate-intensity exercise). Do strengthening exercises at least twice a week. This is in addition to the moderate-intensity exercise. Spend less time sitting. Even light physical activity can be beneficial. Watch cholesterol and blood lipids Have your blood tested for lipids and cholesterol at 60 years of age, then have this test every 5 years. Have your cholesterol levels checked more often if: Your lipid or cholesterol levels are high. You are older than 60  years of age. You are at high risk for heart disease. What should I know about cancer screening? Depending on your health history and family history, you may need to have cancer screening at various ages. This may include screening for: Breast cancer. Cervical cancer. Colorectal cancer. Skin cancer. Lung cancer. What should I know about heart disease, diabetes, and high blood pressure? Blood pressure and heart disease High blood pressure causes heart disease and increases the risk of stroke. This is more likely to develop in people who have high blood pressure readings or are overweight. Have your blood pressure checked: Every 3-5 years if you are 2-109 years of age. Every year if you are 49 years old or older. Diabetes Have regular diabetes screenings. This checks your fasting blood sugar level. Have the screening done: Once every three years after age 45 if you are at a normal weight and have a low risk for diabetes. More often and at a younger age if you are overweight or have a high risk for diabetes. What should I know about preventing infection? Hepatitis B If you have a higher risk for hepatitis B, you should be screened for this virus. Talk with your health care provider to find out if you are at risk for hepatitis B infection. Hepatitis C Testing is recommended for: Everyone born from [birth year] through 1967. Anyone with known risk factors for hepatitis C. Sexually transmitted infections (STIs) Get screened for STIs, including gonorrhea and chlamydia, if: You are sexually active and are younger than 60 years of age. You are older than 60 years of age and your health care  provider tells you that you are at risk for this type of infection. Your sexual activity has changed since you were last screened, and you are at increased risk for chlamydia or gonorrhea. Ask your health care provider if you are at risk. Ask your health care provider about whether you are at high risk for HIV. Your  health care provider may recommend a prescription medicine to help prevent HIV infection. If you choose to take medicine to prevent HIV, you should first get tested for HIV. You should then be tested every 3 months for as long as you are taking the medicine. Osteoporosis and menopause Osteoporosis is a disease in which the bones lose minerals and strength with aging. This can result in bone fractures. If you are 72 years old or older, or if you are at risk for osteoporosis and fractures, ask your health care provider if you should: Be screened for bone loss. Take a calcium or vitamin D supplement to lower your risk of fractures. Be given hormone replacement therapy (HRT) to treat symptoms of menopause. Follow these instructions at home: Alcohol use Do not drink alcohol if: Your health care provider tells you not to drink. You are pregnant, may be pregnant, or are planning to become pregnant. If you drink alcohol: Limit how much you have to: 0-1 drink a day. Know how much alcohol is in your drink. In the [place] one drink equals one 12 oz bottle of beer (355 mL), one 5 oz glass of wine (148 mL), or one 1 oz glass of hard liquor (44 mL). Lifestyle Do not use any products that contain nicotine or tobacco. These products include cigarettes, chewing tobacco, and vaping devices, such as e-cigarettes. If you need help quitting, ask your health care provider. Do not use street drugs. Do not share needles. Ask your health care provider for help if you need support or information about quitting drugs. General instructions Schedule regular health, dental, and eye exams. Stay current with your vaccines. Tell your health care provider if: You often feel depressed. You have ever been abused or do not feel safe at home. Summary Adopting a healthy lifestyle and getting preventive care are important in promoting health and wellness. Follow your health care provider's instructions about healthy diet,  exercising, and getting tested or screened for diseases. Follow your health care provider's instructions on monitoring your cholesterol and blood pressure. This information is not intended to replace advice given to you by your health care provider. Make sure you discuss any questions you have with your health care provider. Document Revised: 09/12/2020 Document Reviewed: 09/12/2020 Elsevier Patient Education  2024 [employer].

## 2023-05-29 ENCOUNTER — Encounter: Payer: Self-pay | Admitting: Obstetrics and Gynecology

## 2023-08-26 ENCOUNTER — Ambulatory Visit (HOSPITAL_BASED_OUTPATIENT_CLINIC_OR_DEPARTMENT_OTHER)
Admission: RE | Admit: 2023-08-26 | Discharge: 2023-08-26 | Disposition: A | Discharge status: Home / Self Care | Payer: Self-pay | Source: Ambulatory Visit | Attending: Obstetrics and Gynecology | Admitting: Obstetrics and Gynecology

## 2023-08-26 DIAGNOSIS — M8580 Other specified disorders of bone density and structure, unspecified site: Secondary | ICD-10-CM | POA: Insufficient documentation

## 2024-05-27 ENCOUNTER — Ambulatory Visit: Payer: 59 | Admitting: Obstetrics and Gynecology

## 2024-06-01 ENCOUNTER — Ambulatory Visit: Admitting: Obstetrics and Gynecology

## 2024-06-22 ENCOUNTER — Ambulatory Visit: Admitting: Obstetrics and Gynecology
